# Patient Record
Sex: Female | Born: 1990 | Hispanic: Yes | Marital: Single | Smoking: Never smoker
Health system: Southern US, Community
[De-identification: ages and names within clinical notes are randomized; demographics above are authoritative.]

## PROBLEM LIST (undated history)

## (undated) DIAGNOSIS — G43909 Migraine, unspecified, not intractable, without status migrainosus: Secondary | ICD-10-CM

## (undated) DIAGNOSIS — C569 Malignant neoplasm of unspecified ovary: Secondary | ICD-10-CM

## (undated) DIAGNOSIS — D4959 Neoplasm of unspecified behavior of other genitourinary organ: Secondary | ICD-10-CM

## (undated) HISTORY — PX: OVARY SURGERY: SHX727

## (undated) HISTORY — DX: Migraine, unspecified, not intractable, without status migrainosus: G43.909

## (undated) HISTORY — DX: Neoplasm of unspecified behavior of other genitourinary organ: D49.59

## (undated) HISTORY — DX: Malignant neoplasm of unspecified ovary: C56.9

---
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
## 2019-09-24 ENCOUNTER — Encounter: Payer: Self-pay | Admitting: Obstetrics and Gynecology

## 2019-09-24 ENCOUNTER — Other Ambulatory Visit: Payer: Self-pay

## 2019-09-24 ENCOUNTER — Ambulatory Visit (INDEPENDENT_AMBULATORY_CARE_PROVIDER_SITE_OTHER): Payer: 59 | Admitting: Obstetrics and Gynecology

## 2019-09-24 VITALS — BP 115/79 | HR 86 | Ht 63.0 in | Wt 161.8 lb

## 2019-09-24 DIAGNOSIS — N912 Amenorrhea, unspecified: Secondary | ICD-10-CM

## 2019-09-24 LAB — POCT URINE PREGNANCY: Preg Test, Ur: POSITIVE — AB

## 2019-09-24 NOTE — Progress Notes (Signed)
HPI:      Ms. Whitney Day is a 29 y.o. No obstetric history on file. who LMP was No LMP recorded.  Subjective:   She presents today for pregnancy confirmation after missing her menstrual period. Patient believes she is approximately 9 weeks estimated gestational age based on last menstrual period.  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  She has been vaccinated.  She is experiencing daily nausea but not vomiting. She is taking prenatal vitamins.    Hx: The following portions of the patient's history were reviewed and updated as appropriate:             She  has a past medical history of Germ cell tumor of ovary. She does not have a problem list on file. She  has no past surgical history on file. Her family history includes Diabetes in her maternal grandfather and mother. She  reports that she has never smoked. She has never used smokeless tobacco. She reports previous alcohol use. She reports previous drug use. She currently has no medications in their medication list. She has No Known Allergies.       Review of Systems:  Review of Systems  Constitutional: Denied constitutional symptoms, night sweats, recent illness, fatigue, fever, insomnia and weight loss.  Eyes: Denied eye symptoms, eye pain, photophobia, vision change and visual disturbance.  Ears/Nose/Throat/Neck: Denied ear, nose, throat or neck symptoms, hearing loss, nasal discharge, sinus congestion and sore throat.  Cardiovascular: Denied cardiovascular symptoms, arrhythmia, chest pain/pressure, edema, exercise intolerance, orthopnea and palpitations.  Respiratory: Denied pulmonary symptoms, asthma, pleuritic pain, productive sputum, cough, dyspnea and wheezing.  Gastrointestinal: Denied, gastro-esophageal reflux, melena, nausea and vomiting.  Genitourinary: Denied genitourinary symptoms including symptomatic vaginal discharge, pelvic relaxation issues, and urinary complaints.  Musculoskeletal:  Denied musculoskeletal symptoms, stiffness, swelling, muscle weakness and myalgia.  Dermatologic: Denied dermatology symptoms, rash and scar.  Neurologic: Denied neurology symptoms, dizziness, headache, neck pain and syncope.  Psychiatric: Denied psychiatric symptoms, anxiety and depression.  Endocrine: Denied endocrine symptoms including hot flashes and night sweats.   Meds:   No current outpatient medications on file prior to visit.   No current facility-administered medications on file prior to visit.    Objective:     Vitals:   09/24/19 1109  BP: 115/79  Pulse: 86              Urinary beta-hCG positive  Assessment:    No obstetric history on file. There are no problems to display for this patient.    1. Amenorrhea     Approximately 9 weeks estimated gestational age   Plan:            Prenatal Plan 1.  The patient was given prenatal literature. 2.  She was continued on prenatal vitamins. 3.  A prenatal lab panel was ordered or drawn. 4.  An ultrasound was ordered to better determine an EDC. 5.  A nurse visit was scheduled. 6.  Genetic testing and testing for other inheritable conditions discussed in detail. She will decide in the future whether to have these labs performed. 7.  A general overview of pregnancy testing, visit schedule, ultrasound schedule, and prenatal care was discussed. 8.  COVID and its risks associated with pregnancy, prevention by limiting exposure and use of masks, as well as the risks and benefits of vaccination during pregnancy were discussed in detail.  [facility] policy regarding office and hospital visitation and testing was explained.  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  She is considering transfer to a different unit. 9.  Benefits of breast-feeding discussed in detail including both maternal and infant benefits. Ready Set Baby website discussed.    Orders Orders Placed This Encounter   Procedures  . POCT urine pregnancy    No orders of the defined types were placed in this encounter.     F/U  Return in about 4 weeks (around 10/22/2019).  Finis Bud, M.D. 09/24/2019 11:41 AM

## 2019-10-01 ENCOUNTER — Other Ambulatory Visit: Payer: Self-pay | Admitting: Obstetrics and Gynecology

## 2019-10-01 ENCOUNTER — Ambulatory Visit (INDEPENDENT_AMBULATORY_CARE_PROVIDER_SITE_OTHER): Payer: 59

## 2019-10-01 ENCOUNTER — Other Ambulatory Visit: Payer: Self-pay

## 2019-10-01 ENCOUNTER — Other Ambulatory Visit: Payer: 59

## 2019-10-01 DIAGNOSIS — Z3A10 10 weeks gestation of pregnancy: Secondary | ICD-10-CM | POA: Diagnosis not present

## 2019-10-01 DIAGNOSIS — Z789 Other specified health status: Secondary | ICD-10-CM

## 2019-10-18 ENCOUNTER — Other Ambulatory Visit: Payer: Self-pay | Admitting: Certified Nurse Midwife

## 2019-10-18 ENCOUNTER — Other Ambulatory Visit: Payer: Self-pay

## 2019-10-18 ENCOUNTER — Encounter: Payer: Self-pay | Admitting: Certified Nurse Midwife

## 2019-10-18 ENCOUNTER — Ambulatory Visit (INDEPENDENT_AMBULATORY_CARE_PROVIDER_SITE_OTHER): Payer: 59 | Admitting: Certified Nurse Midwife

## 2019-10-18 VITALS — BP 124/79 | HR 109 | Ht 64.0 in | Wt 166.1 lb

## 2019-10-18 DIAGNOSIS — Z3401 Encounter for supervision of normal first pregnancy, first trimester: Secondary | ICD-10-CM

## 2019-10-18 DIAGNOSIS — Z0283 Encounter for blood-alcohol and blood-drug test: Secondary | ICD-10-CM | POA: Diagnosis not present

## 2019-10-18 DIAGNOSIS — Z113 Encounter for screening for infections with a predominantly sexual mode of transmission: Secondary | ICD-10-CM

## 2019-10-18 NOTE — Progress Notes (Signed)
Whitney Day presents for NOB nurse intake visit. Pregnancy confirmation done at [facility], 09/24/2019, with Renee Harder.  G 1.  P0.  LMP 07/22/2019 EDD 04/27/2020.  Ga [redacted]w[redacted]d . Pregnancy education material explained and given.  0 cats in the home.  2 dogs in home. NOB labs ordered. BMI less than 30. TSH/HbgA1c not ordered. Sickle cell order due to race. HIV and drug screen explained and ordered. Genetic screening discussed. Genetic testing; Ordered. Pt to discuss genetic testing with provider. PNV encouraged. Pt to follow up with provider in 3 days for NOB physical. [place], FMLA forms and HIV/Drug Screening forms signed and reviewed with patient.    Pt requested to see a midwife.

## 2019-10-18 NOTE — Patient Instructions (Signed)
WHAT OB PATIENTS CAN EXPECT   Confirmation of pregnancy and ultrasound ordered if medically indicated-[redacted] weeks gestation  New OB (NOB) intake with nurse and New OB (NOB) labs- [redacted] weeks gestation  New OB (NOB) physical examination with provider- 11/[redacted] weeks gestation  Flu vaccine-[redacted] weeks gestation  Anatomy scan-[redacted] weeks gestation  Glucose tolerance test, blood work to test for anemia, T-dap vaccine-[redacted] weeks gestation  Vaginal swabs/cultures-STD/Group B strep-[redacted] weeks gestation  Appointments every 4 weeks until 28 weeks  Every 2 weeks from 28 weeks until 36 weeks  Weekly visits from 36 weeks until delivery  Morning Sickness  Morning sickness is when you feel sick to your stomach (nauseous) during pregnancy. You may feel sick to your stomach and throw up (vomit). You may feel sick in the morning, but you can feel this way at any time of day. Some women feel very sick to their stomach and cannot stop throwing up (hyperemesis gravidarum). Follow these instructions at home: Medicines  Take over-the-counter and prescription medicines only as told by your doctor. Do not take any medicines until you talk with your doctor about them first.  Taking multivitamins before getting pregnant can stop or lessen the harshness of morning sickness. Eating and drinking  Eat dry toast or crackers before getting out of bed.  Eat 5 or 6 small meals a day.  Eat dry and bland foods like rice and baked potatoes.  Do not eat greasy, fatty, or spicy foods.  Have someone cook for you if the smell of food causes you to feel sick or throw up.  If you feel sick to your stomach after taking prenatal vitamins, take them at night or with a snack.  Eat protein when you need a snack. Nuts, yogurt, and cheese are good choices.  Drink fluids throughout the day.  Try ginger ale made with real ginger, ginger tea made from fresh grated ginger, or ginger candies. General instructions  Do not use any products  that have nicotine or tobacco in them, such as cigarettes and e-cigarettes. If you need help quitting, ask your doctor.  Use an air purifier to keep the air in your house free of smells.  Get lots of fresh air.  Try to avoid smells that make you feel sick.  Try: ? Wearing a bracelet that is used for seasickness (acupressure wristband). ? Going to a doctor who puts thin needles into certain body points (acupuncture) to improve how you feel. Contact a doctor if:  You need medicine to feel better.  You feel dizzy or light-headed.  You are losing weight. Get help right away if:  You feel very sick to your stomach and cannot stop throwing up.  You pass out (faint).  You have very bad pain in your belly. Summary  Morning sickness is when you feel sick to your stomach (nauseous) during pregnancy.  You may feel sick in the morning, but you can feel this way at any time of day.  Making some changes to what you eat may help your symptoms go away. This information is not intended to replace advice given to you by your health care provider. Make sure you discuss any questions you have with your health care provider. Document Revised: 01/27/2017 Document Reviewed: 03/17/2016 Elsevier Patient Education  2020 [employer]. How a Baby Grows During Pregnancy  Pregnancy begins when a female's sperm enters a female's egg (fertilization). Fertilization usually happens in one of the tubes (fallopian tubes) that connect the ovaries to the  womb (uterus). The fertilized egg moves down the fallopian tube to the uterus. Once it reaches the uterus, it implants into the lining of the uterus and begins to grow. For the first 10 weeks, the fertilized egg is called an embryo. After 10 weeks, it is called a fetus. As the fetus continues to grow, it receives oxygen and nutrients through tissue (placenta) that grows to support the developing baby. The placenta is the life support system for the baby. It provides  oxygen and nutrition and removes waste. Learning as much as you can about your pregnancy and how your baby is developing can help you enjoy the experience. It can also make you aware of when there might be a problem and when to ask questions. How long does a typical pregnancy last? A pregnancy usually lasts 280 days, or about 40 weeks. Pregnancy is divided into three periods of growth, also called trimesters:  First trimester: 0-12 weeks.  Second trimester: 13-27 weeks.  Third trimester: 28-40 weeks. The day when your baby is ready to be born (full term) is your estimated date of delivery. How does my baby develop month by month? First month  The fertilized egg attaches to the inside of the uterus.  Some cells will form the placenta. Others will form the fetus.  The arms, legs, brain, spinal cord, lungs, and heart begin to develop.  At the end of the first month, the heart begins to beat. Second month  The bones, inner ear, eyelids, hands, and feet form.  The genitals develop.  By the end of 8 weeks, all major organs are developing. Third month  All of the internal organs are forming.  Teeth develop below the gums.  Bones and muscles begin to grow. The spine can flex.  The skin is transparent.  Fingernails and toenails begin to form.  Arms and legs continue to grow longer, and hands and feet develop.  The fetus is about 3 inches (7.6 cm) long. Fourth month  The placenta is completely formed.  The external sex organs, neck, outer ear, eyebrows, eyelids, and fingernails are formed.  The fetus can hear, swallow, and move its arms and legs.  The kidneys begin to produce urine.  The skin is covered with a white, waxy coating (vernix) and very fine hair (lanugo). Fifth month  The fetus moves around more and can be felt for the first time (quickening).  The fetus starts to sleep and wake up and may begin to suck its finger.  The nails grow to the end of the  fingers.  The organ in the digestive system that makes bile (gallbladder) functions and helps to digest nutrients.  If your baby is a girl, eggs are present in her ovaries. If your baby is a boy, testicles start to move down into his scrotum. Sixth month  The lungs are formed.  The eyes open. The brain continues to develop.  Your baby has fingerprints and toe prints. Your baby's hair grows thicker.  At the end of the second trimester, the fetus is about 9 inches (22.9 cm) long. Seventh month  The fetus kicks and stretches.  The eyes are developed enough to sense changes in light.  The hands can make a grasping motion.  The fetus responds to sound. Eighth month  All organs and body systems are fully developed and functioning.  Bones harden, and taste buds develop. The fetus may hiccup.  Certain areas of the brain are still developing. The skull remains soft.   Ninth month  The fetus gains about  lb (0.23 kg) each week.  The lungs are fully developed.  Patterns of sleep develop.  The fetus's head typically moves into a head-down position (vertex) in the uterus to prepare for birth.  The fetus weighs 6-9 lb (2.72-4.08 kg) and is 19-20 inches (48.26-50.8 cm) long. What can I do to have a healthy pregnancy and help my baby develop? General instructions  Take prenatal vitamins as directed by your health care provider. These include vitamins such as folic acid, iron, calcium, and vitamin D. They are important for healthy development.  Take medicines only as directed by your health care provider. Read labels and ask a pharmacist or your health care provider whether over-the-counter medicines, supplements, and prescription drugs are safe to take during pregnancy.  Keep all follow-up visits as directed by your health care provider. This is important. Follow-up visits include prenatal care and screening tests. How do I know if my baby is developing well? At each prenatal visit,  your health care provider will do several different tests to check on your health and keep track of your baby's development. These include:  Fundal height and position. ? Your health care provider will measure your growing belly from your pubic bone to the top of the uterus using a tape measure. ? Your health care provider will also feel your belly to determine your baby's position.  Heartbeat. ? An ultrasound in the first trimester can confirm pregnancy and show a heartbeat, depending on how far along you are. ? Your health care provider will check your baby's heart rate at every prenatal visit.  Second trimester ultrasound. ? This ultrasound checks your baby's development. It also may show your baby's gender. What should I do if I have concerns about my baby's development? Always talk with your health care provider about any concerns that you may have about your pregnancy and your baby. Summary  A pregnancy usually lasts 280 days, or about 40 weeks. Pregnancy is divided into three periods of growth, also called trimesters.  Your health care provider will monitor your baby's growth and development throughout your pregnancy.  Follow your health care provider's recommendations about taking prenatal vitamins and medicines during your pregnancy.  Talk with your health care provider if you have any concerns about your pregnancy or your developing baby. This information is not intended to replace advice given to you by your health care provider. Make sure you discuss any questions you have with your health care provider. Document Revised: 06/07/2018 Document Reviewed: 12/28/2016 Elsevier Patient Education  2020 [place] of Pregnancy  The first trimester of pregnancy is from week 1 until the end of week 13 (months 1 through 3). During this time, your baby will begin to develop inside you. At 6-8 weeks, the eyes and face are formed, and the heartbeat can be seen on  ultrasound. At the end of 12 weeks, all the baby's organs are formed. Prenatal care is all the medical care you receive before the birth of your baby. Make sure you get good prenatal care and follow all of your doctor's instructions. Follow these instructions at home: Medicines  Take over-the-counter and prescription medicines only as told by your doctor. Some medicines are safe and some medicines are not safe during pregnancy.  Take a prenatal vitamin that contains at least 600 micrograms (mcg) of folic acid.  If you have trouble pooping (constipation), take medicine that will make your stool soft (stool  softener) if your doctor approves. Eating and drinking   Eat regular, healthy meals.  Your doctor will tell you the amount of weight gain that is right for you.  Avoid raw meat and uncooked cheese.  If you feel sick to your stomach (nauseous) or throw up (vomit): ? Eat 4 or 5 small meals a day instead of 3 large meals. ? Try eating a few soda crackers. ? Drink liquids between meals instead of during meals.  To prevent constipation: ? Eat foods that are high in fiber, like fresh fruits and vegetables, whole grains, and beans. ? Drink enough fluids to keep your pee (urine) clear or pale yellow. Activity  Exercise only as told by your doctor. Stop exercising if you have cramps or pain in your lower belly (abdomen) or low back.  Do not exercise if it is too hot, too humid, or if you are in a place of great height (high altitude).  Try to avoid standing for long periods of time. Move your legs often if you must stand in one place for a long time.  Avoid heavy lifting.  Wear low-heeled shoes. Sit and stand up straight.  You can have sex unless your doctor tells you not to. Relieving pain and discomfort  Wear a good support bra if your breasts are sore.  Take warm water baths (sitz baths) to soothe pain or discomfort caused by hemorrhoids. Use hemorrhoid cream if your doctor says  it is okay.  Rest with your legs raised if you have leg cramps or low back pain.  If you have puffy, bulging veins (varicose veins) in your legs: ? Wear support hose or compression stockings as told by your doctor. ? Raise (elevate) your feet for 15 minutes, 3-4 times a day. ? Limit salt in your food. Prenatal care  Schedule your prenatal visits by the twelfth week of pregnancy.  Write down your questions. Take them to your prenatal visits.  Keep all your prenatal visits as told by your doctor. This is important. Safety  Wear your seat belt at all times when driving.  Make a list of emergency phone numbers. The list should include numbers for family, friends, the hospital, and police and fire departments. General instructions  Ask your doctor for a referral to a local prenatal class. Begin classes no later than at the start of month 6 of your pregnancy.  Ask for help if you need counseling or if you need help with nutrition. Your doctor can give you advice or tell you where to go for help.  Do not use hot tubs, steam rooms, or saunas.  Do not douche or use tampons or scented sanitary pads.  Do not cross your legs for long periods of time.  Avoid all herbs and alcohol. Avoid drugs that are not approved by your doctor.  Do not use any tobacco products, including cigarettes, chewing tobacco, and electronic cigarettes. If you need help quitting, ask your doctor. You may get counseling or other support to help you quit.  Avoid cat litter boxes and soil used by cats. These carry germs that can cause birth defects in the baby and can cause a loss of your baby (miscarriage) or stillbirth.  Visit your dentist. At home, brush your teeth with a soft toothbrush. Be gentle when you floss. Contact a doctor if:  You are dizzy.  You have mild cramps or pressure in your lower belly.  You have a nagging pain in your belly area.  You  continue to feel sick to your stomach, you throw up, or  you have watery poop (diarrhea).  You have a bad smelling fluid coming from your vagina.  You have pain when you pee (urinate).  You have increased puffiness (swelling) in your face, hands, legs, or ankles. Get help right away if:  You have a fever.  You are leaking fluid from your vagina.  You have spotting or bleeding from your vagina.  You have very bad belly cramping or pain.  You gain or lose weight rapidly.  You throw up blood. It may look like coffee grounds.  You are around people who have [place] measles, fifth disease, or chickenpox.  You have a very bad headache.  You have shortness of breath.  You have any kind of trauma, such as from a fall or a car accident. Summary  The first trimester of pregnancy is from week 1 until the end of week 13 (months 1 through 3).  To take care of yourself and your unborn baby, you will need to eat healthy meals, take medicines only if your doctor tells you to do so, and do activities that are safe for you and your baby.  Keep all follow-up visits as told by your doctor. This is important as your doctor will have to ensure that your baby is healthy and growing well. This information is not intended to replace advice given to you by your health care provider. Make sure you discuss any questions you have with your health care provider. Document Revised: 06/07/2018 Document Reviewed: 02/23/2016 Elsevier Patient Education  2020 [employer]. Commonly Asked Questions During Pregnancy  Cats: A parasite can be excreted in cat feces.  To avoid exposure you need to have another person empty the little box.  If you must empty the litter box you will need to wear gloves.  Wash your hands after handling your cat.  This parasite can also be found in raw or undercooked meat so this should also be avoided.  Colds, Sore Throats, Flu: Please check your medication sheet to see what you can take for symptoms.  If your symptoms are unrelieved by these  medications please call the office.  Dental Work: Most any dental work [employer] banker[employer] corporate recommends is permitted.  X-rays should only be taken during the first trimester if absolutely necessary.  Your abdomen should be shielded with a lead apron during all x-rays.  Please notify your provider prior to receiving any x-rays.  Novocaine is fine; gas is not recommended.  If your dentist requires a note from [place] prior to dental work please call the office and we will provide one for you.  Exercise: Exercise is an important part of staying healthy during your pregnancy.  You may continue most exercises you were accustomed to prior to pregnancy.  Later in your pregnancy you will most likely notice you have difficulty with activities requiring balance like riding a bicycle.  It is important that you listen to your body and avoid activities that put you at a higher risk of falling.  Adequate rest and staying well hydrated are a must!  If you have questions about the safety of specific activities ask your provider.    Exposure to Children with illness: Try to avoid obvious exposure; report any symptoms to [place] when noted,  If you have chicken pos, red measles or mumps, you should be immune to these diseases.   Please do not take any vaccines while pregnant unless you have checked  with your OB provider.  Fetal Movement: After 28 weeks we recommend you do "kick counts" twice daily.  Lie or sit down in a calm quiet environment and count your baby movements "kicks".  You should feel your baby at least 10 times per hour.  If you have not felt 10 kicks within the first hour get up, walk around and have something sweet to eat or drink then repeat for an additional hour.  If count remains less than 10 per hour notify your provider.  Fumigating: Follow your pest control agent's advice as to how long to stay out of your home.  Ventilate the area well before re-entering.  Hemorrhoids:   Most over-the-counter preparations can be used  during pregnancy.  Check your medication to see what is safe to use.  It is important to use a stool softener or fiber in your diet and to drink lots of liquids.  If hemorrhoids seem to be getting worse please call the office.   Hot Tubs:  Hot tubs Jacuzzis and saunas are not recommended while pregnant.  These increase your internal body temperature and should be avoided.  Intercourse:  Sexual intercourse is safe during pregnancy as long as you are comfortable, unless otherwise advised by your provider.  Spotting may occur after intercourse; report any bright red bleeding that is heavier than spotting.  Labor:  If you know that you are in labor, please go to the hospital.  If you are unsure, please call the office and let us help you decide what to do.  Lifting, straining, etc:  If your job requires heavy lifting or straining please check with your provider for any limitations.  Generally, you should not lift items heavier than that you can lift simply with your hands and arms (no back muscles)  Painting:  Paint fumes do not harm your pregnancy, but may make you ill and should be avoided if possible.  Latex or water based paints have less odor than oils.  Use adequate ventilation while painting.  Permanents & Hair Color:  Chemicals in hair dyes are not recommended as they cause increase hair dryness which can increase hair loss during pregnancy.  " Highlighting" and permanents are allowed.  Dye may be absorbed differently and permanents may not hold as well during pregnancy.  Sunbathing:  Use a sunscreen, as skin burns easily during pregnancy.  Drink plenty of fluids; avoid over heating.  Tanning Beds:  Because their possible side effects are still unknown, tanning beds are not recommended.  Ultrasound Scans:  Routine ultrasounds are performed at approximately 20 weeks.  You will be able to see your baby's general anatomy an if you would like to know the gender this can usually be determined as  well.  If it is questionable when you conceived you may also receive an ultrasound early in your pregnancy for dating purposes.  Otherwise ultrasound exams are not routinely performed unless there is a medical necessity.  Although you can request a scan we ask that you pay for it when conducted because insurance does not cover " patient request" scans.  Work: If your pregnancy proceeds without complications you may work until your due date, unless your physician or employer advises otherwise.  Round Ligament Pain/Pelvic Discomfort:  Sharp, shooting pains not associated with bleeding are fairly common, usually occurring in the second trimester of pregnancy.  They tend to be worse when standing up or when you remain standing for long periods of time.  These are  the result of pressure of certain pelvic ligaments called "round ligaments".  Rest, Tylenol and heat seem to be the most effective relief.  As the womb and fetus grow, they rise out of the pelvis and the discomfort improves.  Please notify the office if your pain seems different than that described.  It may represent a more serious condition.  Common Medications Safe in Pregnancy  Acne:      Constipation:  Benzoyl Peroxide     Colace  Clindamycin      Dulcolax Suppository  Topica Erythromycin     Fibercon  Salicylic Acid      Metamucil         Miralax AVOID:        Senakot   Accutane    Cough:  Retin-A       Cough Drops  Tetracycline      Phenergan w/ Codeine if Rx  Minocycline      Robitussin (Plain & DM)  Antibiotics:     Crabs/Lice:  Ceclor       RID  Cephalosporins    AVOID:  E-Mycins      Kwell  Keflex  Macrobid/Macrodantin   Diarrhea:  Penicillin      Kao-Pectate  Zithromax      Imodium AD         PUSH FLUIDS AVOID:       Cipro     Fever:  Tetracycline      Tylenol (Regular or Extra  Minocycline       Strength)  Levaquin      Extra Strength-Do not          Exceed 8 tabs/24 hrs Caffeine:        <200mg/day (equiv. To 1 cup  of coffee or  approx. 3 12 oz sodas)         Gas: Cold/Hayfever:       Gas-X  Benadryl      Mylicon  Claritin       Phazyme  **Claritin-D        Chlor-Trimeton    Headaches:  Dimetapp      ASA-Free Excedrin  Drixoral-Non-Drowsy     Cold Compress  Mucinex (Guaifenasin)     Tylenol (Regular or Extra  Sudafed/Sudafed-12 Hour     Strength)  **Sudafed PE Pseudoephedrine   Tylenol Cold & Sinus     Vicks Vapor Rub  Zyrtec  **AVOID if Problems With Blood Pressure         Heartburn: Avoid lying down for at least 1 hour after meals  Aciphex      Maalox     Rash:  Milk of Magnesia     Benadryl    Mylanta       1% Hydrocortisone Cream  Pepcid  Pepcid Complete   Sleep Aids:  Prevacid      Ambien   Prilosec       Benadryl  Rolaids       Chamomile Tea  Tums (Limit 4/day)     Unisom         Tylenol PM         Warm milk-add vanilla or  Hemorrhoids:       Sugar for taste  Anusol/Anusol H.C.  (RX: Analapram 2.5%)  Sugar Substitutes:  Hydrocortisone OTC     Ok in moderation  Preparation H      Tucks        Vaseline lotion applied to tissue with wiping    Herpes:       Throat:  Acyclovir      Oragel  Famvir  Valtrex     Vaccines:         Flu Shot Leg Cramps:       *Gardasil  Benadryl      Hepatitis A         Hepatitis B Nasal Spray:       Pneumovax  Saline Nasal Spray     Polio Booster         Tetanus Nausea:       Tuberculosis test or PPD  Vitamin B6 25 mg TID   AVOID:    Dramamine      *Gardasil  Emetrol       Live Poliovirus  Ginger Root 250 mg QID    MMR (measles, mumps &  High Complex Carbs @ Bedtime    rebella)  Sea Bands-Accupressure    Varicella (Chickenpox)  Unisom 1/2 tab TID     *No known complications           If received before Pain:         Known pregnancy;   Darvocet       Resume series after  Lortab        Delivery  Percocet    Yeast:   Tramadol      Femstat  Tylenol 3      Gyne-lotrimin  Ultram       Monistat  Vicodin           MISC:         All  Sunscreens           Hair Coloring/highlights          Insect Repellant's          (Including DEET)         Mystic Tans

## 2019-10-21 ENCOUNTER — Ambulatory Visit (INDEPENDENT_AMBULATORY_CARE_PROVIDER_SITE_OTHER): Payer: 59 | Admitting: Certified Nurse Midwife

## 2019-10-21 ENCOUNTER — Other Ambulatory Visit: Payer: Self-pay

## 2019-10-21 ENCOUNTER — Encounter: Payer: Self-pay | Admitting: Certified Nurse Midwife

## 2019-10-21 ENCOUNTER — Other Ambulatory Visit (HOSPITAL_COMMUNITY)
Admission: RE | Admit: 2019-10-21 | Discharge: 2019-10-21 | Disposition: A | Discharge status: Home / Self Care | Payer: 59 | Source: Ambulatory Visit | Attending: Obstetrics and Gynecology | Admitting: Obstetrics and Gynecology

## 2019-10-21 VITALS — BP 114/62 | HR 94 | Wt 165.3 lb

## 2019-10-21 DIAGNOSIS — Z87898 Personal history of other specified conditions: Secondary | ICD-10-CM | POA: Insufficient documentation

## 2019-10-21 DIAGNOSIS — Z3402 Encounter for supervision of normal first pregnancy, second trimester: Secondary | ICD-10-CM | POA: Diagnosis present

## 2019-10-21 DIAGNOSIS — Z3A13 13 weeks gestation of pregnancy: Secondary | ICD-10-CM | POA: Diagnosis present

## 2019-10-21 DIAGNOSIS — Z131 Encounter for screening for diabetes mellitus: Secondary | ICD-10-CM

## 2019-10-21 DIAGNOSIS — Z8349 Family history of other endocrine, nutritional and metabolic diseases: Secondary | ICD-10-CM

## 2019-10-21 DIAGNOSIS — Z833 Family history of diabetes mellitus: Secondary | ICD-10-CM

## 2019-10-21 DIAGNOSIS — Z8742 Personal history of other diseases of the female genital tract: Secondary | ICD-10-CM | POA: Insufficient documentation

## 2019-10-21 DIAGNOSIS — Z124 Encounter for screening for malignant neoplasm of cervix: Secondary | ICD-10-CM

## 2019-10-21 DIAGNOSIS — O210 Mild hyperemesis gravidarum: Secondary | ICD-10-CM

## 2019-10-21 DIAGNOSIS — Z1379 Encounter for other screening for genetic and chromosomal anomalies: Secondary | ICD-10-CM

## 2019-10-21 DIAGNOSIS — R399 Unspecified symptoms and signs involving the genitourinary system: Secondary | ICD-10-CM

## 2019-10-21 LAB — POCT URINALYSIS DIPSTICK OB
Bilirubin, UA: NEGATIVE
Blood, UA: NEGATIVE
Glucose, UA: NEGATIVE
Ketones, UA: NEGATIVE
Leukocytes, UA: NEGATIVE
Nitrite, UA: NEGATIVE
POC,PROTEIN,UA: NEGATIVE
Spec Grav, UA: 1.015 (ref 1.010–1.025)
Urobilinogen, UA: 0.2 E.U./dL
pH, UA: 5 (ref 5.0–8.0)

## 2019-10-21 LAB — HGB SOLU + RFLX FRAC: Sickle Solubility Test - HGBRFX: NEGATIVE

## 2019-10-21 LAB — TOXOPLASMA ANTIBODIES- IGG AND  IGM
Toxoplasma Antibody- IgM: <3 AU/mL (ref 0.0–7.9)
Toxoplasma IgG Ratio: <3 IU/mL (ref 0.0–7.1)

## 2019-10-21 LAB — HEPATITIS B SURFACE ANTIGEN: Hepatitis B Surface Ag: NEGATIVE

## 2019-10-21 LAB — ABO AND RH: Rh Factor: POSITIVE

## 2019-10-21 LAB — RUBELLA SCREEN: Rubella Antibodies, IGG: <0.9 index — ABNORMAL LOW (ref >0.99)

## 2019-10-21 LAB — VARICELLA ZOSTER ANTIBODY, IGG: Varicella zoster IgG: 215 index (ref >165)

## 2019-10-21 LAB — RPR: RPR Ser Ql: NONREACTIVE

## 2019-10-21 LAB — HIV ANTIBODY (ROUTINE TESTING W REFLEX): HIV Screen 4th Generation wRfx: NONREACTIVE

## 2019-10-21 LAB — ANTIBODY SCREEN: Antibody Screen: NEGATIVE

## 2019-10-21 MED ORDER — NITROFURANTOIN MONOHYD MACRO 100 MG PO CAPS: 100.0000 mg | ORAL_CAPSULE | Freq: Two times a day (BID) | ORAL | 0 refills | Status: DC

## 2019-10-21 NOTE — Patient Instructions (Addendum)
Healthy Weight Gain During Pregnancy, Adult A certain amount of weight gain during pregnancy is normal and healthy. How much weight you should gain depends on your overall health and a measurement called BMI (body mass index). BMI is an estimate of your body fat based on your height and weight. You can use an online calculator to figure out your BMI, or you can ask your health care provider to calculate it for you at your next visit. Your recommended pregnancy weight gain is based on your pre-pregnancy BMI. General guidelines for a healthy total weight gain during pregnancy are listed below. If your BMI at or before the start of your pregnancy is:  Less than 18.5 (underweight), you should gain 28-40 lb (13-18 kg).  18.5-24.9 (normal weight), you should gain 25-35 lb (11-16 kg).  25-29.9 (overweight), you should gain 15-25 lb (7-11 kg).  30 or higher (obese), you should gain 11-20 lb (5-9 kg). These ranges vary depending on your individual health. If you are carrying more than one baby (multiples), it may be safe to gain more weight than these recommendations. If you gain less weight than recommended, that may be safe as long as your baby is growing and developing normally. How can unhealthy weight gain affect me and my baby? Gaining too much weight during pregnancy can lead to pregnancy complications, such as:  A temporary form of diabetes that develops during pregnancy (gestational diabetes).  High blood pressure during pregnancy and protein in your urine (preeclampsia).  High blood pressure during pregnancy without protein in your urine (gestational hypertension).  Your baby having a high weight at birth, which may: ? Raise your risk of having a more difficult delivery or a surgical delivery (cesarean delivery, or C-section). ? Raise your child's risk of developing obesity during childhood. Not gaining enough weight can be life-threatening for your baby, and it may raise your baby's chances  of:  Being born early (preterm).  Growing more slowly than normal during pregnancy (growth restriction).  Having a low weight at birth. What actions can I take to gain a healthy amount of weight during pregnancy? General instructions  Keep track of your weight gain during pregnancy.  Take over-the-counter and prescription medicines only as told by your health care provider. Take all prenatal supplements as directed.  Keep all health care visits during pregnancy (prenatal visits). These visits are a good time to discuss your weight gain. Your health care provider will weigh you at each visit to make sure you are gaining a healthy amount of weight. Nutrition   Eat a balanced, nutrient-rich diet. Eat plenty of: ? Fruits and vegetables, such as berries and broccoli. ? Whole grains, such as millet, barley, whole-wheat breads and cereals, and oatmeal. ? Low-fat dairy products or non-dairy products such as almond milk or rice milk. ? Protein foods, such as lean meat, chicken, eggs, and legumes (such as peas, beans, soybeans, and lentils).  Avoid foods that are fried or have a lot of fat, salt (sodium), or sugar.  Drink enough fluid to keep your urine pale yellow.  Choose healthy snack and drink options when you are at work or on the go: ? Drink water. Avoid soda, sports drinks, and juices that have added sugar. ? Avoid drinks with caffeine, such as coffee and energy drinks. ? Eat snacks that are high in protein, such as nuts, protein bars, and low-fat yogurt. ? Carry convenient snacks in your purse that do not need refrigeration, such as a pack of   trail mix, an apple, or a granola bar.  If you need help improving your diet, work with a health care provider or a diet and nutrition specialist (dietitian). Activity   Exercise regularly, as told by your health care provider. ? If you were active before becoming pregnant, you may be able to continue your regular fitness activities. ? If  you were not active before pregnancy, you may gradually build up to exercising for 30 or more minutes on most days of the week. This may include walking, swimming, or yoga.  Ask your health care provider what activities are safe for you. Talk with your health care provider about whether you may need to be excused from certain school or work activities. Where to find more information Learn more about managing your weight gain during pregnancy from:  American Pregnancy Association: www.americanpregnancy.org  [place] Department of Agriculture pregnancy weight gain calculator: [employer] Summary  Too much weight gain during pregnancy can lead to complications for you and your baby.  Find out your pre-pregnancy BMI to determine how much weight gain is healthy for you.  Eat nutritious foods and stay active.  Keep all of your prenatal visits as told by your health care provider. This information is not intended to replace advice given to you by your health care provider. Make sure you discuss any questions you have with your health care provider. Document Revised: 11/07/2018 Document Reviewed: 11/04/2016 Elsevier Patient Education  [place].  Exercise During Pregnancy Exercise is an important part of being healthy for people of all ages. Exercise improves the function of your heart and lungs and helps you maintain strength, flexibility, and a healthy body weight. Exercise also boosts energy levels and elevates mood. Most women should exercise regularly during pregnancy. In rare cases, women with certain medical conditions or complications may be asked to limit or avoid exercise during pregnancy. How does this affect me? Along with maintaining general strength and flexibility, exercising during pregnancy can help:  Keep strength in muscles that are used during labor and childbirth.  Decrease low back pain.  Reduce symptoms of depression.  Control weight gain during  pregnancy.  Reduce the risk of needing insulin if you develop diabetes during pregnancy.  Decrease the risk of cesarean delivery.  Speed up your recovery after giving birth. How does this affect my baby? Exercise can help you have a healthy pregnancy. Exercise does not cause premature birth. It will not cause your baby to weigh less at birth. What exercises can I do? Many exercises are safe for you to do during pregnancy. Do a variety of exercises that safely increase your heart and breathing rates and help you build and maintain muscle strength. Do exercises exactly as told by your health care provider. You may do these exercises:  Walking or hiking.  Swimming.  Water aerobics.  Riding a stationary bike.  Strength training.  Modified yoga or Pilates. Tell your instructor that you are pregnant. Avoid overstretching, and avoid lying on your back for long periods of time.  Running or jogging. Only choose this type of exercise if you: ? Ran or jogged regularly before your pregnancy. ? Can run or jog and still talk in complete sentences. What exercises should I avoid? Depending on your level of fitness and whether you exercised regularly before your pregnancy, you may be told to limit high-intensity exercise. You can tell that you are exercising at a high intensity if you are breathing much harder and faster and cannot  hold a conversation while exercising. You must avoid:  Contact sports.  Activities that put you at risk for falling on or being hit in the belly, such as downhill skiing, water skiing, surfing, rock climbing, cycling, gymnastics, and horseback riding.  Scuba diving.  Skydiving.  Yoga or Pilates in a room that is heated to high temperatures.  Jogging or running, unless you ran or jogged regularly before your pregnancy. While jogging or running, you should always be able to talk in full sentences. Do not run or jog so fast that you are unable to have a  conversation.  Do not exercise at more than 6,000 feet above sea level (high elevation) if you are not used to exercising at high elevation. How do I exercise in a safe way?   Avoid overheating. Do not exercise in very high temperatures.  Wear loose-fitting, breathable clothes.  Avoid dehydration. Drink enough water before, during, and after exercise to keep your urine pale yellow.  Avoid overstretching. Because of hormone changes during pregnancy, it is easy to overstretch muscles, tendons, and ligaments during pregnancy.  Start slowly and ask your health care provider to recommend the types of exercise that are safe for you.  Do not exercise to lose weight. Follow these instructions at home:  Exercise on most days or all days of the week. Try to exercise for 30 minutes a day, 5 days a week, unless your health care provider tells you not to.  If you actively exercised before your pregnancy and you are healthy, your health care provider may tell you to continue to do moderate to high-intensity exercise.  If you are just starting to exercise or did not exercise much before your pregnancy, your health care provider may tell you to do low to moderate-intensity exercise. Questions to ask your health care provider  Is exercise safe for me?  What are signs that I should stop exercising?  Does my health condition mean that I should not exercise during pregnancy?  When should I avoid exercising during pregnancy? Stop exercising and contact a health care provider if: You have any unusual symptoms, such as:  Mild contractions of the uterus or cramps in the abdomen.  Dizziness that does not go away when you rest. Stop exercising and get help right away if: You have any unusual symptoms, such as:  Sudden, severe pain in your low back or your belly.  Mild contractions of the uterus or cramps in the abdomen that do not improve with rest and drinking fluids.  Chest pain.  Bleeding or  fluid leaking from your vagina.  Shortness of breath. These symptoms may represent a serious problem that is an emergency. Do not wait to see if the symptoms will go away. Get medical help right away. Call your local emergency services (911 in the [place] Do not drive yourself to the hospital. Summary  Most women should exercise regularly throughout pregnancy. In rare cases, women with certain medical conditions or complications may be asked to limit or avoid exercise during pregnancy.  Do not exercise to lose weight during pregnancy.  Your health care provider will tell you what level of physical activity is right for you.  Stop exercising and contact a health care provider if you have mild contractions of the uterus or cramps in the abdomen. Get help right away if these contractions or cramps do not improve with rest and drinking fluids.  Stop exercising and get help right away if you have sudden, severe pain  in your low back or belly, chest pain, shortness of breath, or bleeding or leaking of fluid from your vagina. This information is not intended to replace advice given to you by your health care provider. Make sure you discuss any questions you have with your health care provider. Document Revised: 06/07/2018 Document Reviewed: 03/21/2018 Elsevier Patient Education  [place].   Common Medications Safe in Pregnancy  Acne:      Constipation:  Benzoyl Peroxide     Colace  Clindamycin      Dulcolax Suppository  Topica Erythromycin     Fibercon  Salicylic Acid      Metamucil         Miralax AVOID:        Senakot   Accutane    Cough:  Retin-A       Cough Drops  Tetracycline      Phenergan w/ Codeine if Rx  Minocycline      Robitussin (Plain & DM)  Antibiotics:     Crabs/Lice:  Ceclor       RID  Cephalosporins    AVOID:  E-Mycins      Kwell  Keflex  Macrobid/Macrodantin   Diarrhea:  Penicillin      Kao-Pectate  Zithromax      Imodium AD         PUSH  FLUIDS AVOID:       Cipro     Fever:  Tetracycline      Tylenol (Regular or Extra  Minocycline       Strength)  Levaquin      Extra Strength-Do not          Exceed 8 tabs/24 hrs Caffeine:        <284m/day (equiv. To 1 cup of coffee or  approx. 3 12 oz sodas)         Gas: Cold/Hayfever:       Gas-X  Benadryl      Mylicon  Claritin       Phazyme  **Claritin-D        Chlor-Trimeton    Headaches:  Dimetapp      ASA-Free Excedrin  Drixoral-Non-Drowsy     Cold Compress  Mucinex (Guaifenasin)     Tylenol (Regular or Extra  Sudafed/Sudafed-12 Hour     Strength)  **Sudafed PE Pseudoephedrine   Tylenol Cold & Sinus     Vicks Vapor Rub  Zyrtec  **AVOID if Problems With Blood Pressure         Heartburn: Avoid lying down for at least 1 hour after meals  Aciphex      Maalox     Rash:  Milk of Magnesia     Benadryl    Mylanta       1% Hydrocortisone Cream  Pepcid  Pepcid Complete   Sleep Aids:  Prevacid      Ambien   Prilosec       Benadryl  Rolaids       Chamomile Tea  Tums (Limit 4/day)     Unisom         Tylenol PM         Warm milk-add vanilla or  Hemorrhoids:       Sugar for taste  Anusol/Anusol H.C.  (RX: Analapram 2.5%)  Sugar Substitutes:  Hydrocortisone OTC     Ok in moderation  Preparation H      Tucks        Vaseline lotion applied to tissue with wiping  Herpes:     Throat:  Acyclovir      Oragel  Famvir  Valtrex     Vaccines:         Flu Shot Leg Cramps:       *Gardasil  Benadryl      Hepatitis A         Hepatitis B Nasal Spray:       Pneumovax  Saline Nasal Spray     Polio Booster         Tetanus Nausea:       Tuberculosis test or PPD  Vitamin B6 25 mg TID   AVOID:    Dramamine      *Gardasil  Emetrol       Live Poliovirus  Ginger Root 250 mg QID    MMR (measles, mumps &  High Complex Carbs @ Bedtime    rebella)  Sea Bands-Accupressure    Varicella (Chickenpox)  Unisom 1/2 tab TID     *No known complications           If received  before Pain:         Known pregnancy;   Darvocet       Resume series after  Lortab        Delivery  Percocet    Yeast:   Tramadol      Femstat  Tylenol 3      Gyne-lotrimin  Ultram       Monistat  Vicodin           MISC:         All Sunscreens           Hair Coloring/highlights          Insect Repellant's          (Including DEET)         Mystic Tans    Second Trimester of Pregnancy  The second trimester is from week 14 through week 27 (month 4 through 6). This is often the time in pregnancy that you feel your best. Often times, morning sickness has lessened or quit. You may have more energy, and you may get hungry more often. Your unborn baby is growing rapidly. At the end of the sixth month, he or she is about 9 inches long and weighs about 1 pounds. You will likely feel the baby move between 18 and 20 weeks of pregnancy. Follow these instructions at home: Medicines  Take over-the-counter and prescription medicines only as told by your doctor. Some medicines are safe and some medicines are not safe during pregnancy.  Take a prenatal vitamin that contains at least 600 micrograms (mcg) of folic acid.  If you have trouble pooping (constipation), take medicine that will make your stool soft (stool softener) if your doctor approves. Eating and drinking   Eat regular, healthy meals.  Avoid raw meat and uncooked cheese.  If you get low calcium from the food you eat, talk to your doctor about taking a daily calcium supplement.  Avoid foods that are high in fat and sugars, such as fried and sweet foods.  If you feel sick to your stomach (nauseous) or throw up (vomit): ? Eat 4 or 5 small meals a day instead of 3 large meals. ? Try eating a few soda crackers. ? Drink liquids between meals instead of during meals.  To prevent constipation: ? Eat foods that are high in fiber, like fresh fruits and vegetables, whole grains, and beans. ? Drink enough fluids  to keep your pee (urine)  clear or pale yellow. Activity  Exercise only as told by your doctor. Stop exercising if you start to have cramps.  Do not exercise if it is too hot, too humid, or if you are in a place of great height (high altitude).  Avoid heavy lifting.  Wear low-heeled shoes. Sit and stand up straight.  You can continue to have sex unless your doctor tells you not to. Relieving pain and discomfort  Wear a good support bra if your breasts are tender.  Take warm water baths (sitz baths) to soothe pain or discomfort caused by hemorrhoids. Use hemorrhoid cream if your doctor approves.  Rest with your legs raised if you have leg cramps or low back pain.  If you develop puffy, bulging veins (varicose veins) in your legs: ? Wear support hose or compression stockings as told by your doctor. ? Raise (elevate) your feet for 15 minutes, 3-4 times a day. ? Limit salt in your food. Prenatal care  Write down your questions. Take them to your prenatal visits.  Keep all your prenatal visits as told by your doctor. This is important. Safety  Wear your seat belt when driving.  Make a list of emergency phone numbers, including numbers for family, friends, the hospital, and police and fire departments. General instructions  Ask your doctor about the right foods to eat or for help finding a counselor, if you need these services.  Ask your doctor about local prenatal classes. Begin classes before month 6 of your pregnancy.  Do not use hot tubs, steam rooms, or saunas.  Do not douche or use tampons or scented sanitary pads.  Do not cross your legs for long periods of time.  Visit your dentist if you have not done so. Use a soft toothbrush to brush your teeth. Floss gently.  Avoid all smoking, herbs, and alcohol. Avoid drugs that are not approved by your doctor.  Do not use any products that contain nicotine or tobacco, such as cigarettes and e-cigarettes. If you need help quitting, ask your  doctor.  Avoid cat litter boxes and soil used by cats. These carry germs that can cause birth defects in the baby and can cause a loss of your baby (miscarriage) or stillbirth. Contact a doctor if:  You have mild cramps or pressure in your lower belly.  You have pain when you pee (urinate).  You have bad smelling fluid coming from your vagina.  You continue to feel sick to your stomach (nauseous), throw up (vomit), or have watery poop (diarrhea).  You have a nagging pain in your belly area.  You feel dizzy. Get help right away if:  You have a fever.  You are leaking fluid from your vagina.  You have spotting or bleeding from your vagina.  You have severe belly cramping or pain.  You lose or gain weight rapidly.  You have trouble catching your breath and have chest pain.  You notice sudden or extreme puffiness (swelling) of your face, hands, ankles, feet, or legs.  You have not felt the baby move in over an hour.  You have severe headaches that do not go away when you take medicine.  You have trouble seeing. Summary  The second trimester is from week 14 through week 27 (months 4 through 6). This is often the time in pregnancy that you feel your best.  To take care of yourself and your unborn baby, you will need to eat healthy meals,  take medicines only if your doctor tells you to do so, and do activities that are safe for you and your baby.  Call your doctor if you get sick or if you notice anything unusual about your pregnancy. Also, call your doctor if you need help with the right food to eat, or if you want to know what activities are safe for you. This information is not intended to replace advice given to you by your health care provider. Make sure you discuss any questions you have with your health care provider. Document Revised: 06/08/2018 Document Reviewed: 03/22/2016 Elsevier Patient Education  [place].  Morning Sickness  Morning sickness is when  you feel sick to your stomach (nauseous) during pregnancy. You may feel sick to your stomach and throw up (vomit). You may feel sick in the morning, but you can feel this way at any time of day. Some women feel very sick to their stomach and cannot stop throwing up (hyperemesis gravidarum). Follow these instructions at home: Medicines  Take over-the-counter and prescription medicines only as told by your doctor. Do not take any medicines until you talk with your doctor about them first.  Taking multivitamins before getting pregnant can stop or lessen the harshness of morning sickness. Eating and drinking  Eat dry toast or crackers before getting out of bed.  Eat 5 or 6 small meals a day.  Eat dry and bland foods like rice and baked potatoes.  Do not eat greasy, fatty, or spicy foods.  Have someone cook for you if the smell of food causes you to feel sick or throw up.  If you feel sick to your stomach after taking prenatal vitamins, take them at night or with a snack.  Eat protein when you need a snack. Nuts, yogurt, and cheese are good choices.  Drink fluids throughout the day.  Try ginger ale made with real ginger, ginger tea made from fresh grated ginger, or ginger candies. General instructions  Do not use any products that have nicotine or tobacco in them, such as cigarettes and e-cigarettes. If you need help quitting, ask your doctor.  Use an air purifier to keep the air in your house free of smells.  Get lots of fresh air.  Try to avoid smells that make you feel sick.  Try: ? Wearing a bracelet that is used for seasickness (acupressure wristband). ? Going to a doctor who puts thin needles into certain body points (acupuncture) to improve how you feel. Contact a doctor if:  You need medicine to feel better.  You feel dizzy or light-headed.  You are losing weight. Get help right away if:  You feel very sick to your stomach and cannot stop throwing up.  You pass  out (faint).  You have very bad pain in your belly. Summary  Morning sickness is when you feel sick to your stomach (nauseous) during pregnancy.  You may feel sick in the morning, but you can feel this way at any time of day.  Making some changes to what you eat may help your symptoms go away. This information is not intended to replace advice given to you by your health care provider. Make sure you discuss any questions you have with your health care provider. Document Revised: 01/27/2017 Document Reviewed: 03/17/2016 Elsevier Patient Education  2020 [employer].

## 2019-10-21 NOTE — Progress Notes (Signed)
NEW OB HISTORY AND PHYSICAL  SUBJECTIVE:       Whitney Day is a 29 y.o. G1P0 female, Patient's last menstrual period was 07/22/2019., Estimated Date of Delivery: 04/27/20, [redacted]w[redacted]d, presents today for establishment of Prenatal Care.  Reports dysuria, intermittent cramping and back pain.   Endorses nausea without vomiting, heartburn and breast tenderness.   Denies difficulty breathing or respiratory distress, chest pain, vaginal bleeding, and leg pain or swelling.   Desires genetic screening (collected Friday). Prefers midwifery care.   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   History significant for germ cell tumor approximately eight (8) years ago.    Gynecologic History  Patient's last menstrual period was 07/22/2019.   Contraception: none   Last Pap: 2019. Results were: abnormal, LSIL with negative Colposcopy  Obstetric History  OB History  Gravida Para Term Preterm AB Living  2       1    SAB TAB Ectopic Multiple Live Births  0 1          # Outcome Date GA Lbr Len/2nd Weight Sex Delivery Anes PTL Lv  2 Current           1 TAB 03/30/15            Past Medical History:  Diagnosis Date  . Germ cell tumor of ovary   . Migraines     Past Surgical History:  Procedure Laterality Date  . OVARY SURGERY      Current Outpatient Medications on File Prior to Visit  Medication Sig Dispense Refill  . Prenatal Vit-Fe Fumarate-FA (PRENATAL VITAMIN PO) Take by mouth.     No current facility-administered medications on file prior to visit.    No Known Allergies  Social History   Socioeconomic History  . Marital status: Single    Spouse name: Not on file  . Number of children: Not on file  . Years of education: Not on file  . Highest education level: Not on file  Occupational History  . Not on file  Tobacco Use  . Smoking status: Never Smoker  . Smokeless tobacco: Never Used  Substance and Sexual Activity  . Alcohol use: Not Currently  . Drug use:  Never  . Sexual activity: Yes    Partners: Male  Other Topics Concern  . Not on file  Social History Narrative  . Not on file   Social Determinants of Health   Financial Resource Strain:   . Difficulty of Paying Living Expenses: Not on file  Food Insecurity:   . Worried About Charity fundraiser in the Last Year: Not on file  . Ran Out of Food in the Last Year: Not on file  Transportation Needs:   . Lack of Transportation (Medical): Not on file  . Lack of Transportation (Non-Medical): Not on file  Physical Activity:   . Days of Exercise per Week: Not on file  . Minutes of Exercise per Session: Not on file  Stress:   . Feeling of Stress : Not on file  Social Connections:   . Frequency of Communication with Friends and Family: Not on file  . Frequency of Social Gatherings with Friends and Family: Not on file  . Attends Religious Services: Not on file  . Active Member of Clubs or Organizations: Not on file  . Attends Archivist Meetings: Not on file  . Marital Status: Not on file  Intimate Partner Violence:   . Fear of Current or  Ex-Partner: Not on file  . Emotionally Abused: Not on file  . Physically Abused: Not on file  . Sexually Abused: Not on file    Family History  Problem Relation Age of Onset  . Diabetes Mother   . Migraines Mother   . Diabetes Maternal Grandfather     The following portions of the patient's history were reviewed and updated as appropriate: allergies, current medications, past OB history, past medical history, past surgical history, past family history, past social history, and problem list.  Review of Systems:  ROS negative except as noted above. Information obtained from patient.   OBJECTIVE:  BP 114/62   Pulse 94   Wt 165 lb 5 oz (75 kg)   LMP 07/22/2019   BMI 28.38 kg/m   Initial Physical Exam (New OB)  GENERAL APPEARANCE: alert, well appearing, in no apparent distress  HEAD: normocephalic, atraumatic  MOUTH: mucous  membranes moist, pharynx normal without lesions  THYROID: no thyromegaly or masses present  BREASTS: no masses noted, no significant tenderness, no palpable axillary nodes, no skin changes  LUNGS: clear to auscultation, no wheezes, rales or rhonchi, symmetric air entry  HEART: regular rate and rhythm, no murmurs  ABDOMEN: soft, nontender, nondistended, no abnormal masses, no epigastric pain, FHT present and skin scar: midline  EXTREMITIES: no redness or tenderness in the calves or thighs, no edema  SKIN: normal coloration and turgor, no rashes  LYMPH NODES: no adenopathy palpable  NEUROLOGIC: alert, oriented, normal speech, no focal findings or movement disorder noted  PELVIC EXAM EXTERNAL GENITALIA: normal appearing vulva with no masses, tenderness or lesions VAGINA: no abnormal discharge or lesions CERVIX: no lesions or cervical motion tenderness and Pap collected  ASSESSMENT: Normal pregnancy Nausea without vomiting in pregnancy UTI symptoms-Rx Macrobid Screening cervical cancer with history of abnormal pap-Pap today History germ cell tumor Family history GDM/Type 2 DM-Early glucola next visit Family history thyroid disease-TSH next visit  PLAN: Prenatal care New OB counseling: The patient has been given an overview regarding routine prenatal care. Recommendations regarding diet, weight gain, and exercise in pregnancy were given. Prenatal testing, optional genetic testing, and ultrasound use in pregnancy were reviewed.  Benefits of Breast Feeding were discussed. The patient is encouraged to consider nursing her baby post partum. See orders

## 2019-10-22 ENCOUNTER — Encounter: Payer: 59 | Admitting: Obstetrics and Gynecology

## 2019-10-22 LAB — CBC
Hematocrit: 37.3 % (ref 34.0–46.6)
Hemoglobin: 12.2 g/dL (ref 11.1–15.9)
MCH: 30 pg (ref 26.6–33.0)
MCHC: 32.7 g/dL (ref 31.5–35.7)
MCV: 92 fL (ref 79–97)
Platelets: 342 10*3/uL (ref 150–450)
RBC: 4.07 x10E6/uL (ref 3.77–5.28)
RDW: 12.8 % (ref 11.7–15.4)
WBC: 7.7 10*3/uL (ref 3.4–10.8)

## 2019-10-22 LAB — DRUG PROFILE, UR, 9 DRUGS (LABCORP)
Amphetamines, Urine: NEGATIVE ng/mL
Barbiturate Quant, Ur: NEGATIVE ng/mL
Benzodiazepine Quant, Ur: NEGATIVE ng/mL
Cannabinoid Quant, Ur: NEGATIVE ng/mL
Cocaine (Metab.): NEGATIVE ng/mL
Methadone Screen, Urine: NEGATIVE ng/mL
Opiate Quant, Ur: NEGATIVE ng/mL
PCP Quant, Ur: NEGATIVE ng/mL
Propoxyphene: NEGATIVE ng/mL

## 2019-10-22 LAB — SPECIMEN STATUS REPORT

## 2019-10-22 LAB — NICOTINE SCREEN, URINE: Cotinine Ql Scrn, Ur: NEGATIVE ng/mL

## 2019-10-23 LAB — CYTOLOGY - PAP: Diagnosis: NEGATIVE

## 2019-10-31 ENCOUNTER — Telehealth: Payer: Self-pay

## 2019-10-31 NOTE — Telephone Encounter (Signed)
Received patient message. Pt was wondering if her partner was able to have genetic screening done here at the office.  Called Carin Beam from [employer] she explained to me that an order would need to be placed. She informed me that she would sign me up for a [employer] portal but it would take 24 hours. Advised me to ask Luana Shu to show me. Advised me that patient can schedule a date for a mobile phlebotomist to go to their home and do the partners screening. Denice Bors will send me an email with instructions for future reference.

## 2019-10-31 NOTE — Telephone Encounter (Signed)
Called patient per Dani Gobble, CNM to inform that her [facility] screening report results were in. 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 Informed pt her spouse will need carrier screening per Annia Friendly. Informed patient that if her spouse is also a carrier this means, their chance to have a child with this condition is 1 in 4 (25%).  Pt verbalized understanding. Pt asked about the gender results and I informed her I sent her a Pharmacist, community message. Pt requests results in an envelope and states her sister -Elvia Aydin will pick envelope up. Pt aware that sister will need to show ID.

## 2019-10-31 NOTE — Telephone Encounter (Signed)
Mychart message sent.

## 2019-11-01 ENCOUNTER — Telehealth: Payer: Self-pay

## 2019-11-01 NOTE — Telephone Encounter (Signed)
Called patient to obtain partners DOB and contact number for a genetic carrier screening order. Informed pt why I needed this information. Pt verbalized understanding.

## 2019-11-04 LAB — GC/CHLAMYDIA PROBE AMP
Chlamydia trachomatis, NAA: NEGATIVE
Neisseria Gonorrhoeae by PCR: NEGATIVE

## 2019-11-20 ENCOUNTER — Encounter: Payer: Self-pay | Admitting: Certified Nurse Midwife

## 2019-11-20 ENCOUNTER — Other Ambulatory Visit: Payer: 59

## 2019-11-20 ENCOUNTER — Ambulatory Visit (INDEPENDENT_AMBULATORY_CARE_PROVIDER_SITE_OTHER): Payer: 59 | Admitting: Certified Nurse Midwife

## 2019-11-20 ENCOUNTER — Other Ambulatory Visit: Payer: Self-pay

## 2019-11-20 VITALS — BP 106/71 | HR 83 | Wt 174.3 lb

## 2019-11-20 DIAGNOSIS — Z3A17 17 weeks gestation of pregnancy: Secondary | ICD-10-CM

## 2019-11-20 DIAGNOSIS — Z23 Encounter for immunization: Secondary | ICD-10-CM

## 2019-11-20 LAB — POCT URINALYSIS DIPSTICK OB
Bilirubin, UA: NEGATIVE
Blood, UA: NEGATIVE
Glucose, UA: NEGATIVE
Ketones, UA: NEGATIVE
Leukocytes, UA: NEGATIVE
Nitrite, UA: NEGATIVE
POC,PROTEIN,UA: NEGATIVE
Spec Grav, UA: 1.025 (ref 1.010–1.025)
Urobilinogen, UA: 0.2 E.U./dL
pH, UA: 5 (ref 5.0–8.0)

## 2019-11-20 NOTE — Progress Notes (Signed)
ROB doing well. Not feeling movement yet. Discussed round ligament pain and self help measures . She verbalizes understanding. Early glucose test today. Will follow up with results. Return in 3 wks for anatomy u/s and ROB with Sharyn Lull.   Philip Aspen, CNM

## 2019-11-20 NOTE — Patient Instructions (Signed)

## 2019-11-21 ENCOUNTER — Other Ambulatory Visit: Payer: Self-pay | Admitting: Certified Nurse Midwife

## 2019-11-21 DIAGNOSIS — O99810 Abnormal glucose complicating pregnancy: Secondary | ICD-10-CM

## 2019-11-21 LAB — GLUCOSE, 1 HOUR GESTATIONAL: Gestational Diabetes Screen: 155 mg/dL — ABNORMAL HIGH (ref 65–139)

## 2019-11-21 LAB — TSH: TSH: 0.656 u[IU]/mL (ref 0.450–4.500)

## 2019-12-11 ENCOUNTER — Other Ambulatory Visit: Payer: Self-pay

## 2019-12-11 ENCOUNTER — Ambulatory Visit: Payer: 59

## 2019-12-11 DIAGNOSIS — Z3A17 17 weeks gestation of pregnancy: Secondary | ICD-10-CM

## 2019-12-13 ENCOUNTER — Encounter: Payer: Self-pay | Admitting: Certified Nurse Midwife

## 2019-12-13 ENCOUNTER — Ambulatory Visit (INDEPENDENT_AMBULATORY_CARE_PROVIDER_SITE_OTHER): Payer: 59 | Admitting: Certified Nurse Midwife

## 2019-12-13 ENCOUNTER — Other Ambulatory Visit: Payer: Self-pay

## 2019-12-13 ENCOUNTER — Other Ambulatory Visit: Payer: 59

## 2019-12-13 VITALS — BP 102/68 | Wt 175.2 lb

## 2019-12-13 DIAGNOSIS — Z3482 Encounter for supervision of other normal pregnancy, second trimester: Secondary | ICD-10-CM

## 2019-12-13 DIAGNOSIS — Z3A20 20 weeks gestation of pregnancy: Secondary | ICD-10-CM

## 2019-12-13 DIAGNOSIS — O99810 Abnormal glucose complicating pregnancy: Secondary | ICD-10-CM

## 2019-12-13 LAB — POCT URINALYSIS DIPSTICK OB
Bilirubin, UA: NEGATIVE
Blood, UA: NEGATIVE
Glucose, UA: NEGATIVE
Ketones, UA: NEGATIVE
Nitrite, UA: NEGATIVE
POC,PROTEIN,UA: NEGATIVE
Spec Grav, UA: 1.01 (ref 1.010–1.025)
Urobilinogen, UA: 0.2 E.U./dL
pH, UA: 7 (ref 5.0–8.0)

## 2019-12-13 NOTE — Progress Notes (Signed)
ROB-Reports round ligament pain and occasional sharp abdominal pains. Discussed home treatment measures including use of abdominal support, handouts provided. Anatomy scan normal but incomplete; see findings below, patient verbalized understanding. Follow up ultrasound scheduled for next Wednesday. GTT today, see orders. Anticipatory guidance regarding course of prenatal care. Reviewed red flag symptoms and when to call. RTC x 4 weeks for ROB or sooner if needed.   ULTRASOUND REPORT  Location: [facility] Date of Service: 12/11/2019   Indications:Anatomy Ultrasound Findings:  Singleton intrauterine pregnancy is visualized with FHR at 145 BPM. Biometrics give an (U/S) Gestational age of [redacted]w[redacted]d and an (U/S) EDD of 02/28/20223; this correlates with the clinically established Estimated Date of Delivery: 04/27/20  Fetal presentation is transverse.  EFW: 352 g ( 12 oz). Fetal Percentile  Placenta: posterior. Grade: 1 AFI: subjectively normal.  Anatomic survey is incomplete for Nose lips and normal; Gender - female.    Right Ovary is normal in appearance. Left Ovary is normal appearance. Survey of the adnexa demonstrates no adnexal masses. There is no free peritoneal fluid in the cul de sac.  Impression: 1. [redacted]w[redacted]d Viable Singleton Intrauterine pregnancy by U/S. 2. (U/S) EDD is consistent with Clinically established Estimated Date of Delivery: 04/27/20 . 3. Incomplete for nose/ lips.  Recommendations: 1.Clinical correlation with the patient's History and Physical Exam.

## 2019-12-13 NOTE — Patient Instructions (Addendum)
Healthy Weight Gain During Pregnancy, Adult A certain amount of weight gain during pregnancy is normal and healthy. How much weight you should gain depends on your overall health and a measurement called BMI (body mass index). BMI is an estimate of your body fat based on your height and weight. You can use an online calculator to figure out your BMI, or you can ask your health care provider to calculate it for you at your next visit. Your recommended pregnancy weight gain is based on your pre-pregnancy BMI. General guidelines for a healthy total weight gain during pregnancy are listed below. If your BMI at or before the start of your pregnancy is:  Less than 18.5 (underweight), you should gain 28-40 lb (13-18 kg).  18.5-24.9 (normal weight), you should gain 25-35 lb (11-16 kg).  25-29.9 (overweight), you should gain 15-25 lb (7-11 kg).  30 or higher (obese), you should gain 11-20 lb (5-9 kg). These ranges vary depending on your individual health. If you are carrying more than one baby (multiples), it may be safe to gain more weight than these recommendations. If you gain less weight than recommended, that may be safe as long as your baby is growing and developing normally. How can unhealthy weight gain affect me and my baby? Gaining too much weight during pregnancy can lead to pregnancy complications, such as:  A temporary form of diabetes that develops during pregnancy (gestational diabetes).  High blood pressure during pregnancy and protein in your urine (preeclampsia).  High blood pressure during pregnancy without protein in your urine (gestational hypertension).  Your baby having a high weight at birth, which may: ? Raise your risk of having a more difficult delivery or a surgical delivery (cesarean delivery, or C-section). ? Raise your child's risk of developing obesity during childhood. Not gaining enough weight can be life-threatening for your baby, and it may raise your baby's chances  of:  Being born early (preterm).  Growing more slowly than normal during pregnancy (growth restriction).  Having a low weight at birth. What actions can I take to gain a healthy amount of weight during pregnancy? General instructions  Keep track of your weight gain during pregnancy.  Take over-the-counter and prescription medicines only as told by your health care provider. Take all prenatal supplements as directed.  Keep all health care visits during pregnancy (prenatal visits). These visits are a good time to discuss your weight gain. Your health care provider will weigh you at each visit to make sure you are gaining a healthy amount of weight. Nutrition   Eat a balanced, nutrient-rich diet. Eat plenty of: ? Fruits and vegetables, such as berries and broccoli. ? Whole grains, such as millet, barley, whole-wheat breads and cereals, and oatmeal. ? Low-fat dairy products or non-dairy products such as almond milk or rice milk. ? Protein foods, such as lean meat, chicken, eggs, and legumes (such as peas, beans, soybeans, and lentils).  Avoid foods that are fried or have a lot of fat, salt (sodium), or sugar.  Drink enough fluid to keep your urine pale yellow.  Choose healthy snack and drink options when you are at work or on the go: ? Drink water. Avoid soda, sports drinks, and juices that have added sugar. ? Avoid drinks with caffeine, such as coffee and energy drinks. ? Eat snacks that are high in protein, such as nuts, protein bars, and low-fat yogurt. ? Carry convenient snacks in your purse that do not need refrigeration, such as a pack of   trail mix, an apple, or a granola bar.  If you need help improving your diet, work with a health care provider or a diet and nutrition specialist (dietitian). Activity   Exercise regularly, as told by your health care provider. ? If you were active before becoming pregnant, you may be able to continue your regular fitness activities. ? If  you were not active before pregnancy, you may gradually build up to exercising for 30 or more minutes on most days of the week. This may include walking, swimming, or yoga.  Ask your health care provider what activities are safe for you. Talk with your health care provider about whether you may need to be excused from certain school or work activities. Where to find more information Learn more about managing your weight gain during pregnancy from:  American Pregnancy Association: www.americanpregnancy.org  [place] Department of Agriculture pregnancy weight gain calculator: www.choosemyplate.gov Summary  Too much weight gain during pregnancy can lead to complications for you and your baby.  Find out your pre-pregnancy BMI to determine how much weight gain is healthy for you.  Eat nutritious foods and stay active.  Keep all of your prenatal visits as told by your health care provider. This information is not intended to replace advice given to you by your health care provider. Make sure you discuss any questions you have with your health care provider. Document Revised: 11/07/2018 Document Reviewed: 11/04/2016 Elsevier Patient Education  2020 [employer]   Common Medications Safe in Pregnancy  Acne:      Constipation:  Benzoyl Peroxide     Colace  Clindamycin      Dulcolax Suppository  [place]ica Erythromycin     Fibercon  Salicylic Acid      Metamucil         Miralax AVOID:        Senakot   Accutane    Cough:  Retin-A       Cough Drops  Tetracycline      Phenergan w/ Codeine if Rx  Minocycline      Robitussin (Plain & DM)  Antibiotics:     Crabs/Lice:  Ceclor       RID  Cephalosporins    AVOID:  E-Mycins      Kwell  Keflex  Macrobid/Macrodantin   Diarrhea:  Penicillin      Kao-Pectate  Zithromax      Imodium AD         PUSH FLUIDS AVOID:       Cipro     Fever:  Tetracycline      Tylenol (Regular or Extra  Minocycline       Strength)  Levaquin      Extra Strength-Do not              Exceed 8 tabs/24 hrs Caffeine:        <200mg/day (equiv. To 1 cup of coffee or  approx. 3 12 oz sodas)         Gas: Cold/Hayfever:       Gas-X  Benadryl      Mylicon  Claritin       Phazyme  **Claritin-D        Chlor-Trimeton    Headaches:  Dimetapp      ASA-Free Excedrin  Drixoral-Non-Drowsy     Cold Compress  Mucinex (Guaifenasin)     Tylenol (Regular or Extra  Sudafed/Sudafed-12 Hour     Strength)  **Sudafed PE Pseudoephedrine   Tylenol Cold & Sinus       Vicks Vapor Rub  Zyrtec  **AVOID if Problems With Blood Pressure         Heartburn: Avoid lying down for at least 1 hour after meals  Aciphex      Maalox     Rash:  Milk of Magnesia     Benadryl    Mylanta       1% Hydrocortisone Cream  Pepcid  Pepcid Complete   Sleep Aids:  Prevacid      Ambien   Prilosec       Benadryl  Rolaids       Chamomile Tea  Tums (Limit 4/day)     Unisom         Tylenol PM         Warm milk-add vanilla or  Hemorrhoids:       Sugar for taste  Anusol/Anusol H.C.  (RX: Analapram 2.5%)  Sugar Substitutes:  Hydrocortisone OTC     Ok in moderation  Preparation H      Tucks        Vaseline lotion applied to tissue with wiping    Herpes:     Throat:  Acyclovir      Oragel  Famvir  Valtrex     Vaccines:         Flu Shot Leg Cramps:       *Gardasil  Benadryl      Hepatitis A         Hepatitis B Nasal Spray:       Pneumovax  Saline Nasal Spray     Polio Booster         Tetanus Nausea:       Tuberculosis test or PPD  Vitamin B6 25 mg TID   AVOID:    Dramamine      *Gardasil  Emetrol       Live Poliovirus  Ginger Root 250 mg QID    MMR (measles, mumps &  High Complex Carbs @ Bedtime    rebella)  Sea Bands-Accupressure    Varicella (Chickenpox)  Unisom 1/2 tab TID     *No known complications           If received before Pain:         Known pregnancy;   Darvocet       Resume series after  Lortab        Delivery  Percocet    Yeast:   Tramadol      Femstat  Tylenol  3      Gyne-lotrimin  Ultram       Monistat  Vicodin           MISC:         All Sunscreens           Hair Coloring/highlights          Insect Repellant's          (Including DEET)         Mystic Tans   Back Pain in Pregnancy Back pain during pregnancy is common. Back pain may be caused by several factors that are related to changes during your pregnancy. Follow these instructions at home: Managing pain, stiffness, and swelling      If directed, for sudden (acute) back pain, put ice on the painful area. ? Put ice in a plastic bag. ? Place a towel between your skin and the bag. ? Leave the ice on for 20 minutes, 2-3 times per day.  If directed, apply heat  to the affected area before you exercise. Use the heat source that your health care provider recommends, such as a moist heat pack or a heating pad. ? Place a towel between your skin and the heat source. ? Leave the heat on for 20-30 minutes. ? Remove the heat if your skin turns bright red. This is especially important if you are unable to feel pain, heat, or cold. You may have a greater risk of getting burned.  If directed, massage the affected area. Activity  Exercise as told by your health care provider. Gentle exercise is the best way to prevent or manage back pain.  Listen to your body when lifting. If lifting hurts, ask for help or bend your knees. This uses your leg muscles instead of your back muscles.  Squat down when picking up something from the floor. Do not bend over.  Only use bed rest for short periods as told by your health care provider. Bed rest should only be used for the most severe episodes of back pain. Standing, sitting, and lying down  Do not stand in one place for long periods of time.  Use good posture when sitting. Make sure your head rests over your shoulders and is not hanging forward. Use a pillow on your lower back if necessary.  Try sleeping on your side, preferably the left side, with a  pregnancy support pillow or 1-2 regular pillows between your legs. ? If you have back pain after a night's rest, your bed may be too soft. ? A firm mattress may provide more support for your back during pregnancy. General instructions  Do not wear high heels.  Eat a healthy diet. Try to gain weight within your health care provider's recommendations.  Use a maternity girdle, elastic sling, or back brace as told by your health care provider.  Take over-the-counter and prescription medicines only as told by your health care provider.  Work with a physical therapist or massage therapist to find ways to manage back pain. Acupuncture or massage therapy may be helpful.  Keep all follow-up visits as told by your health care provider. This is important. Contact a health care provider if:  Your back pain interferes with your daily activities.  You have increasing pain in other parts of your body. Get help right away if:  You develop numbness, tingling, weakness, or problems with the use of your arms or legs.  You develop severe back pain that is not controlled with medicine.  You have a change in bowel or bladder control.  You develop shortness of breath, dizziness, or you faint.  You develop nausea, vomiting, or sweating.  You have back pain that is a rhythmic, cramping pain similar to labor pains. Labor pain is usually 1-2 minutes apart, lasts for about 1 minute, and involves a bearing down feeling or pressure in your pelvis.  You have back pain and your water breaks or you have vaginal bleeding.  You have back pain or numbness that travels down your leg.  Your back pain developed after you fell.  You develop pain on one side of your back.  You see blood in your urine.  You develop skin blisters in the area of your back pain. Summary  Back pain may be caused by several factors that are related to changes during your pregnancy.  Follow instructions as told by your health care  provider for managing pain, stiffness, and swelling.  Exercise as told by your health care provider. Gentle exercise  is the best way to prevent or manage back pain.  Take over-the-counter and prescription medicines only as told by your health care provider.  Keep all follow-up visits as told by your health care provider. This is important. This information is not intended to replace advice given to you by your health care provider. Make sure you discuss any questions you have with your health care provider. Document Revised: 06/05/2018 Document Reviewed: 08/02/2017 Elsevier Patient Education  [place] of Pregnancy  The second trimester is from week 14 through week 27 (month 4 through 6). This is often the time in pregnancy that you feel your best. Often times, morning sickness has lessened or quit. You may have more energy, and you may get hungry more often. Your unborn baby is growing rapidly. At the end of the sixth month, he or she is about 9 inches long and weighs about 1 pounds. You will likely feel the baby move between 18 and 20 weeks of pregnancy. Follow these instructions at home: Medicines  Take over-the-counter and prescription medicines only as told by your doctor. Some medicines are safe and some medicines are not safe during pregnancy.  Take a prenatal vitamin that contains at least 600 micrograms (mcg) of folic acid.  If you have trouble pooping (constipation), take medicine that will make your stool soft (stool softener) if your doctor approves. Eating and drinking   Eat regular, healthy meals.  Avoid raw meat and uncooked cheese.  If you get low calcium from the food you eat, talk to your doctor about taking a daily calcium supplement.  Avoid foods that are high in fat and sugars, such as fried and sweet foods.  If you feel sick to your stomach (nauseous) or throw up (vomit): ? Eat 4 or 5 small meals a day instead of 3 large  meals. ? Try eating a few soda crackers. ? Drink liquids between meals instead of during meals.  To prevent constipation: ? Eat foods that are high in fiber, like fresh fruits and vegetables, whole grains, and beans. ? Drink enough fluids to keep your pee (urine) clear or pale yellow. Activity  Exercise only as told by your doctor. Stop exercising if you start to have cramps.  Do not exercise if it is too hot, too humid, or if you are in a place of great height (high altitude).  Avoid heavy lifting.  Wear low-heeled shoes. Sit and stand up straight.  You can continue to have sex unless your doctor tells you not to. Relieving pain and discomfort  Wear a good support bra if your breasts are tender.  Take warm water baths (sitz baths) to soothe pain or discomfort caused by hemorrhoids. Use hemorrhoid cream if your doctor approves.  Rest with your legs raised if you have leg cramps or low back pain.  If you develop puffy, bulging veins (varicose veins) in your legs: ? Wear support hose or compression stockings as told by your doctor. ? Raise (elevate) your feet for 15 minutes, 3-4 times a day. ? Limit salt in your food. Prenatal care  Write down your questions. Take them to your prenatal visits.  Keep all your prenatal visits as told by your doctor. This is important. Safety  Wear your seat belt when driving.  Make a list of emergency phone numbers, including numbers for family, friends, the hospital, and police and fire departments. General instructions  Ask your doctor about the right foods to eat or  for help finding a counselor, if you need these services.  Ask your doctor about local prenatal classes. Begin classes before month 6 of your pregnancy.  Do not use hot tubs, steam rooms, or saunas.  Do not douche or use tampons or scented sanitary pads.  Do not cross your legs for long periods of time.  Visit your dentist if you have not done so. Use a soft toothbrush  to brush your teeth. Floss gently.  Avoid all smoking, herbs, and alcohol. Avoid drugs that are not approved by your doctor.  Do not use any products that contain nicotine or tobacco, such as cigarettes and e-cigarettes. If you need help quitting, ask your doctor.  Avoid cat litter boxes and soil used by cats. These carry germs that can cause birth defects in the baby and can cause a loss of your baby (miscarriage) or stillbirth. Contact a doctor if:  You have mild cramps or pressure in your lower belly.  You have pain when you pee (urinate).  You have bad smelling fluid coming from your vagina.  You continue to feel sick to your stomach (nauseous), throw up (vomit), or have watery poop (diarrhea).  You have a nagging pain in your belly area.  You feel dizzy. Get help right away if:  You have a fever.  You are leaking fluid from your vagina.  You have spotting or bleeding from your vagina.  You have severe belly cramping or pain.  You lose or gain weight rapidly.  You have trouble catching your breath and have chest pain.  You notice sudden or extreme puffiness (swelling) of your face, hands, ankles, feet, or legs.  You have not felt the baby move in over an hour.  You have severe headaches that do not go away when you take medicine.  You have trouble seeing. Summary  The second trimester is from week 14 through week 27 (months 4 through 6). This is often the time in pregnancy that you feel your best.  To take care of yourself and your unborn baby, you will need to eat healthy meals, take medicines only if your doctor tells you to do so, and do activities that are safe for you and your baby.  Call your doctor if you get sick or if you notice anything unusual about your pregnancy. Also, call your doctor if you need help with the right food to eat, or if you want to know what activities are safe for you. This information is not intended to replace advice given to you by  your health care provider. Make sure you discuss any questions you have with your health care provider. Document Revised: 06/08/2018 Document Reviewed: 03/22/2016 Elsevier Patient Education  2020 [place]. 12/18/2019

## 2019-12-14 LAB — GESTATIONAL GLUCOSE TOLERANCE
Glucose, Fasting: 89 mg/dL (ref 65–94)
Glucose, GTT - 1 Hour: 147 mg/dL (ref 65–179)
Glucose, GTT - 2 Hour: 106 mg/dL (ref 65–154)
Glucose, GTT - 3 Hour: 88 mg/dL (ref 65–139)

## 2019-12-18 ENCOUNTER — Ambulatory Visit (INDEPENDENT_AMBULATORY_CARE_PROVIDER_SITE_OTHER): Payer: 59

## 2019-12-18 ENCOUNTER — Other Ambulatory Visit: Payer: Self-pay

## 2019-12-18 ENCOUNTER — Other Ambulatory Visit: Payer: Self-pay | Admitting: Certified Nurse Midwife

## 2019-12-18 ENCOUNTER — Telehealth: Payer: Self-pay

## 2019-12-18 DIAGNOSIS — Z09 Encounter for follow-up examination after completed treatment for conditions other than malignant neoplasm: Secondary | ICD-10-CM

## 2019-12-18 NOTE — Telephone Encounter (Signed)
Dental clearance letter faxed per patient request. [phone] mychart message sent to patient

## 2019-12-18 NOTE — Telephone Encounter (Signed)
Letter sent.

## 2019-12-19 ENCOUNTER — Telehealth: Payer: Self-pay

## 2019-12-19 NOTE — Telephone Encounter (Signed)
mychart message sent to patient

## 2020-01-14 ENCOUNTER — Ambulatory Visit (INDEPENDENT_AMBULATORY_CARE_PROVIDER_SITE_OTHER): Payer: 59 | Admitting: Certified Nurse Midwife

## 2020-01-14 ENCOUNTER — Encounter: Payer: Self-pay | Admitting: Certified Nurse Midwife

## 2020-01-14 ENCOUNTER — Encounter: Payer: 59 | Admitting: Certified Nurse Midwife

## 2020-01-14 ENCOUNTER — Other Ambulatory Visit: Payer: Self-pay

## 2020-01-14 VITALS — BP 117/65 | HR 90

## 2020-01-14 DIAGNOSIS — Z3482 Encounter for supervision of other normal pregnancy, second trimester: Secondary | ICD-10-CM

## 2020-01-14 DIAGNOSIS — Z3A25 25 weeks gestation of pregnancy: Secondary | ICD-10-CM | POA: Diagnosis not present

## 2020-01-14 NOTE — Patient Instructions (Signed)
Td (Tetanus, Diphtheria) Vaccine: What You Need to Know 1. Why get vaccinated? Td vaccine can prevent tetanus and diphtheria. Tetanus enters the body through cuts or wounds. Diphtheria spreads from person to person.  TETANUS (T) causes painful stiffening of the muscles. Tetanus can lead to serious health problems, including being unable to open the mouth, having trouble swallowing and breathing, or death.  DIPHTHERIA (D) can lead to difficulty breathing, heart failure, paralysis, or death. 2. Td vaccine Td is only for children 7 years and older, adolescents, and adults.  Td is usually given as a booster dose every 10 years, but it can also be given earlier after a severe and dirty wound or burn. Another vaccine, called Tdap, that protects against pertussis, also known as "whooping cough," in addition to tetanus and diphtheria, may be used instead of Td.  Td may be given at the same time as other vaccines. 3. Talk with your health care provider Tell your vaccine provider if the person getting the vaccine:  Has had an allergic reaction after a previous dose of any vaccine that protects against tetanus or diphtheria, or has any severe, life-threatening allergies.  Has ever had Guillain-Barr Syndrome (also called GBS).  Has had severe pain or swelling after a previous dose of any vaccine that protects against tetanus or diphtheria. In some cases, your health care provider may decide to postpone Td vaccination to a future visit.  People with minor illnesses, such as a cold, may be vaccinated. People who are moderately or severely ill should usually wait until they recover before getting Td vaccine.  Your health care provider can give you more information. 4. Risks of a vaccine reaction  Pain, redness, or swelling where the shot was given, mild fever, headache, feeling tired, and nausea, vomiting, diarrhea, or stomachache sometimes happen after Td vaccine. People sometimes faint after medical  procedures, including vaccination. Tell your provider if you feel dizzy or have vision changes or ringing in the ears.  As with any medicine, there is a very remote chance of a vaccine causing a severe allergic reaction, other serious injury, or death. 5. What if there is a serious problem? An allergic reaction could occur after the vaccinated person leaves the clinic. If you see signs of a severe allergic reaction (hives, swelling of the face and throat, difficulty breathing, a fast heartbeat, dizziness, or weakness), call 9-1-1 and get the person to the nearest hospital.  For other signs that concern you, call your health care provider.  Adverse reactions should be reported to the Vaccine Adverse Event Reporting System (VAERS). Your health care provider will usually file this report, or you can do it yourself. Visit the VAERS website at www.vaers.hhs.gov or call 1-800-822-7967. VAERS is only for reporting reactions, and VAERS staff do not give medical advice. 6. The National Vaccine Injury Compensation Program The National Vaccine Injury Compensation Program (VICP) is a federal program that was created to compensate people who may have been injured by certain vaccines. Visit the VICP website at www.hrsa.gov/vaccinecompensation or call 1-800-338-2382 to learn about the program and about filing a claim. There is a time limit to file a claim for compensation. 7. How can I learn more?  Ask your health care provider.  Call your local or state health department.  Contact the Centers for Disease Control and Prevention (CDC): ? Call 1-800-232-4636 (1-800-CDC-INFO) or ? Visit CDC's website at www.cdc.gov/vaccines Vaccine Information Statement Td Vaccine (05/30/18) This information is not intended to replace advice given   to you by your health care provider. Make sure you discuss any questions you have with your health care provider. Document Revised: 07/09/2018 Document Reviewed: 06/11/2018 Elsevier  Patient Education  [place]. Glucose Tolerance Test During Pregnancy Why am I having this test? The glucose tolerance test (GTT) is done to check how your body processes sugar (glucose). This is one of several tests used to diagnose diabetes that develops during pregnancy (gestational diabetes mellitus). Gestational diabetes is a temporary form of diabetes that some women develop during pregnancy. It usually occurs during the second trimester of pregnancy and goes away after delivery. Testing (screening) for gestational diabetes usually occurs between 24 and 28 weeks of pregnancy. You may have the GTT test after having a 1-hour glucose screening test if the results from that test indicate that you may have gestational diabetes. You may also have this test if:  You have a history of gestational diabetes.  You have a history of giving birth to very large babies or have experienced repeated fetal loss (stillbirth).  You have signs and symptoms of diabetes, such as: ? Changes in your vision. ? Tingling or numbness in your hands or feet. ? Changes in hunger, thirst, and urination that are not otherwise explained by your pregnancy. What is being tested? This test measures the amount of glucose in your blood at different times during a period of 3 hours. This indicates how well your body is able to process glucose. What kind of sample is taken?  Blood samples are required for this test. They are usually collected by inserting a needle into a blood vessel. How do I prepare for this test?  For 3 days before your test, eat normally. Have plenty of carbohydrate-rich foods.  Follow instructions from your health care provider about: ? Eating or drinking restrictions on the day of the test. You may be asked to not eat or drink anything other than water (fast) starting 8-10 hours before the test. ? Changing or stopping your regular medicines. Some medicines may interfere with this test. Tell a  health care provider about:  All medicines you are taking, including vitamins, herbs, eye drops, creams, and over-the-counter medicines.  Any blood disorders you have.  Any surgeries you have had.  Any medical conditions you have. What happens during the test? First, your blood glucose will be measured. This is referred to as your fasting blood glucose, since you fasted before the test. Then, you will drink a glucose solution that contains a certain amount of glucose. Your blood glucose will be measured again 1, 2, and 3 hours after drinking the solution. This test takes about 3 hours to complete. You will need to stay at the testing location during this time. During the testing period:  Do not eat or drink anything other than the glucose solution.  Do not exercise.  Do not use any products that contain nicotine or tobacco, such as cigarettes and e-cigarettes. If you need help stopping, ask your health care provider. The testing procedure may vary among health care providers and hospitals. How are the results reported? Your results will be reported as milligrams of glucose per deciliter of blood (mg/dL) or millimoles per liter (mmol/L). Your health care provider will compare your results to normal ranges that were established after testing a large group of people (reference ranges). Reference ranges may vary among labs and hospitals. For this test, common reference ranges are:  Fasting: less than 95-105 mg/dL (5.3-5.8 mmol/L).  1 hour  after drinking glucose: less than 180-190 mg/dL (10.0-10.5 mmol/L).  2 hours after drinking glucose: less than 155-165 mg/dL (8.6-9.2 mmol/L).  3 hours after drinking glucose: 140-145 mg/dL (7.8-8.1 mmol/L). What do the results mean? Results within reference ranges are considered normal, meaning that your glucose levels are well-controlled. If two or more of your blood glucose levels are high, you may be diagnosed with gestational diabetes. If only one  level is high, your health care provider may suggest repeat testing or other tests to confirm a diagnosis. Talk with your health care provider about what your results mean. Questions to ask your health care provider Ask your health care provider, or the department that is doing the test:  When will my results be ready?  How will I get my results?  What are my treatment options?  What other tests do I need?  What are my next steps? Summary  The glucose tolerance test (GTT) is one of several tests used to diagnose diabetes that develops during pregnancy (gestational diabetes mellitus). Gestational diabetes is a temporary form of diabetes that some women develop during pregnancy.  You may have the GTT test after having a 1-hour glucose screening test if the results from that test indicate that you may have gestational diabetes. You may also have this test if you have any symptoms or risk factors for gestational diabetes.  Talk with your health care provider about what your results mean. This information is not intended to replace advice given to you by your health care provider. Make sure you discuss any questions you have with your health care provider. Document Revised: 06/07/2018 Document Reviewed: 09/26/2016 Elsevier Patient Education  [place].

## 2020-01-14 NOTE — Progress Notes (Signed)
ROB doing well. Feels good movement. Discussed 28 wk labs and tdap next visit. She verbalizes understanding. Suggested doing 3 hr screen since she had to do for the early glucose screen. States that she moved to [facility] and is going to be changing practice closer to her home. She is encouraged to follow up with [place] until she establish a new provider. She verbalizes and agree. Follow up 3 wks if she has not started care at new practice.   Philip Aspen, CNM

## 2020-02-07 ENCOUNTER — Other Ambulatory Visit: Payer: 59

## 2020-02-07 ENCOUNTER — Encounter: Payer: 59 | Admitting: Certified Nurse Midwife

## 2021-07-02 ENCOUNTER — Other Ambulatory Visit: Payer: Self-pay

## 2021-07-02 ENCOUNTER — Emergency Department: Payer: BC Managed Care – PPO

## 2021-07-02 ENCOUNTER — Emergency Department
Admission: EM | Admit: 2021-07-02 | Discharge: 2021-07-03 | Disposition: A | Discharge status: Home / Self Care | Payer: BC Managed Care – PPO | Attending: Emergency Medicine | Admitting: Emergency Medicine

## 2021-07-02 DIAGNOSIS — D72829 Elevated white blood cell count, unspecified: Secondary | ICD-10-CM | POA: Diagnosis not present

## 2021-07-02 DIAGNOSIS — R590 Localized enlarged lymph nodes: Secondary | ICD-10-CM | POA: Diagnosis not present

## 2021-07-02 DIAGNOSIS — R591 Generalized enlarged lymph nodes: Secondary | ICD-10-CM

## 2021-07-02 DIAGNOSIS — R220 Localized swelling, mass and lump, head: Secondary | ICD-10-CM | POA: Diagnosis present

## 2021-07-02 LAB — COMPREHENSIVE METABOLIC PANEL
ALT: 22 U/L (ref 0–44)
AST: 17 U/L (ref 15–41)
Albumin: 4.4 g/dL (ref 3.5–5.0)
Alkaline Phosphatase: 96 U/L (ref 38–126)
Anion gap: 7 (ref 5–15)
BUN: 14 mg/dL (ref 6–20)
CO2: 25 mmol/L (ref 22–32)
Calcium: 9.2 mg/dL (ref 8.9–10.3)
Chloride: 105 mmol/L (ref 98–111)
Creatinine, Ser: 0.64 mg/dL (ref 0.44–1.00)
GFR, Estimated: >60 mL/min (ref >60)
Glucose, Bld: 97 mg/dL (ref 70–99)
Potassium: 4 mmol/L (ref 3.5–5.1)
Sodium: 137 mmol/L (ref 135–145)
Total Bilirubin: 0.9 mg/dL (ref 0.3–1.2)
Total Protein: 8.1 g/dL (ref 6.5–8.1)

## 2021-07-02 LAB — CBC WITH DIFFERENTIAL/PLATELET
Abs Immature Granulocytes: 0.03 10*3/uL (ref 0.00–0.07)
Basophils Absolute: 0.1 10*3/uL (ref 0.0–0.1)
Basophils Relative: 1 %
Eosinophils Absolute: 0.1 10*3/uL (ref 0.0–0.5)
Eosinophils Relative: 1 %
HCT: 38 % (ref 36.0–46.0)
Hemoglobin: 12.5 g/dL (ref 12.0–15.0)
Immature Granulocytes: 0 %
Lymphocytes Relative: 35 %
Lymphs Abs: 3.8 10*3/uL (ref 0.7–4.0)
MCH: 28.9 pg (ref 26.0–34.0)
MCHC: 32.9 g/dL (ref 30.0–36.0)
MCV: 87.8 fL (ref 80.0–100.0)
Monocytes Absolute: 0.7 10*3/uL (ref 0.1–1.0)
Monocytes Relative: 6 %
Neutro Abs: 6.3 10*3/uL (ref 1.7–7.7)
Neutrophils Relative %: 57 %
Platelets: 330 10*3/uL (ref 150–400)
RBC: 4.33 MIL/uL (ref 3.87–5.11)
RDW: 12.1 % (ref 11.5–15.5)
WBC: 10.9 10*3/uL — ABNORMAL HIGH (ref 4.0–10.5)
nRBC: 0 % (ref 0.0–0.2)

## 2021-07-02 MED ORDER — HYDROXYZINE PAMOATE 100 MG PO CAPS: 100.0000 mg | ORAL_CAPSULE | Freq: Three times a day (TID) | ORAL | 0 refills | Status: AC | PRN

## 2021-07-02 MED ORDER — HYDROXYZINE PAMOATE 100 MG PO CAPS: 100.0000 mg | ORAL_CAPSULE | Freq: Three times a day (TID) | ORAL | 0 refills | Status: DC | PRN

## 2021-07-02 MED ORDER — IOHEXOL 300 MG/ML  SOLN
75.0000 mL | Freq: Once | INTRAMUSCULAR | Status: AC | PRN
  Administered 2021-07-02: 75 mL via INTRAVENOUS
  Filled 2021-07-02: qty 75

## 2021-07-02 NOTE — Discharge Instructions (Addendum)
-  Follow-up with your primary care provider as needed. ? ?-You may take the hydroxyzine as needed for anxiety. ? ?-Return to the emergency department anytime if you begin to experience any new or worsening symptoms. ?

## 2021-07-02 NOTE — ED Provider Notes (Signed)
? ?[facility] ?Provider Note ? ? ? None  ?  (approximate) ? ? ?History  ? ?Chief Complaint ?Otalgia and Headache ? ? ?HPI ?Whitney Day is a 31 y.o. female, history of migraines, germ cell tumor, presents to the emergency department for evaluation of facial swelling.  Patient states that for the past 3 weeks she has been experiencing pain/swelling along the left side of her face, particular under the left mandible and around the left ear.  She states that she has a hard nodule underneath her left jaw which is painful for her.  Reports pain extending down into her neck as well.  Denies fever/chills, chest pain, shortness of breath, abdominal pain, flank pain, dysphagia, nausea/vomiting, diarrhea, urinary symptoms, vision changes, hearing changes, dizziness/lightheadedness, or no/tingling upper lower extremities.  ? ?History Limitations: No limitations. ? ?    ? ? ?Physical Exam  ?Triage Vital Signs: ?ED Triage Vitals  ?Enc Vitals Group  ?   BP 07/02/21 2007 114/77  ?   Pulse Rate 07/02/21 2007 85  ?   Resp 07/02/21 2007 16  ?   Temp 07/02/21 2007 98 ?F (36.7 ?C)  ?   Temp src --   ?   SpO2 07/02/21 2007 100 %  ?   Weight 07/02/21 2008 177 lb (80.3 kg)  ?   Height 07/02/21 2008 '5\' 4"'$  (1.626 m)  ?   Head Circumference --   ?   Peak Flow --   ?   Pain Score 07/02/21 2008 7  ?   Pain Loc --   ?   Pain Edu? --   ?   Excl. in [place]? --   ? ? ?Most recent vital signs: ?Vitals:  ? 07/02/21 2007 07/03/21 0012  ?BP: 114/77 121/75  ?Pulse: 85 81  ?Resp: 16 16  ?Temp: 98 ?F (36.7 ?C)   ?SpO2: 100% 100%  ? ? ?General: Awake, extremely anxious.  Tearful. ?Skin: Warm, dry. No rashes or lesions.  ?Eyes: PERRL. Conjunctivae normal.  ?CV: Good peripheral perfusion.  ?Resp: Normal effort.  ?Abd: Soft, non-tender. No distention.  ?Neuro: At baseline. No gross neurological deficits.  ? ?Focused Exam: No noticeable facial swelling present.  She does have submandibular lymphadenopathy present, as well as some  preauricular lymphadenopathy.  Throat exam unremarkable, no tonsillar swelling, exudates, or erythema.  Uvula midline.  No bony tenderness along the maxillofacial structures. ? ?Ear exam unremarkable bilaterally.  No bulging, erythema, or foreign bodies.  No erythema or tenderness around the mastoid process ? ?Dental exam unremarkable.  No evidence of erythema around the gumline or fluctuant abscesses.  No significant dental caries or fractures. ? ?Physical Exam ? ? ? ?ED Results / Procedures / Treatments  ?Labs ?(all labs ordered are listed, but only abnormal results are displayed) ?Labs Reviewed  ?CBC WITH DIFFERENTIAL/PLATELET - Abnormal; Notable for the following components:  ?    Result Value  ? WBC 10.9 (*)   ? All other components within normal limits  ?COMPREHENSIVE METABOLIC PANEL  ?POC URINE PREG, ED  ? ? ? ?EKG ?N/A. ? ? ?RADIOLOGY ? ?ED Provider Interpretation: I personally reviewed and interpreted this image, no acute abnormalities present. ? ?CT Soft Tissue Neck W Contrast ? ?Result Date: 07/02/2021 ?CLINICAL DATA:  Left-sided facial swelling EXAM: CT NECK WITH CONTRAST TECHNIQUE: Multidetector CT imaging of the neck was performed using the standard protocol following the bolus administration of intravenous contrast. RADIATION DOSE REDUCTION: This exam was performed according to the  departmental dose-optimization program which includes automated exposure control, adjustment of the mA and/or kV according to patient size and/or use of iterative reconstruction technique. CONTRAST:  59m OMNIPAQUE IOHEXOL 300 MG/ML  SOLN COMPARISON:  None Available. FINDINGS: PHARYNX AND LARYNX: The nasopharynx, oropharynx and larynx are normal. Visible portions of the oral cavity, tongue base and floor of mouth are normal. Normal epiglottis, vallecula and pyriform sinuses. The larynx is normal. No retropharyngeal abscess, effusion or lymphadenopathy. SALIVARY GLANDS: Normal parotid, submandibular and sublingual glands.  THYROID: Normal. LYMPH NODES: No enlarged or abnormal density lymph nodes. VASCULAR: Major cervical vessels are patent. LIMITED INTRACRANIAL: Normal. VISUALIZED ORBITS: Normal. MASTOIDS AND VISUALIZED PARANASAL SINUSES: No fluid levels or advanced mucosal thickening. No mastoid effusion. SKELETON: No bony spinal canal stenosis. No lytic or blastic lesions. UPPER CHEST: Clear. OTHER: None. IMPRESSION: Normal CT of the neck. Electronically Signed   By: KUlyses JarredM.D.   On: 07/02/2021 22:44   ? ?PROCEDURES: ? ?Critical Care performed: None. ? ?Procedures ? ? ? ?MEDICATIONS ORDERED IN ED: ?Medications  ?iohexol (OMNIPAQUE) 300 MG/ML solution 75 mL (75 mLs Intravenous Contrast Given 07/02/21 2231)  ? ? ? ?IMPRESSION / MDM / ASSESSMENT AND PLAN / ED COURSE  ?I reviewed the triage vital signs and the nursing notes. ?             ?               ? ?Differential diagnosis includes, but is not limited to, retropharyngeal abscess, dental infection/abscess, reactive lymphadenopathy, strep pharyngitis, otitis media ? ?ED Course ?Patient appears stable, but extremely anxious.  Vitals within normal limits. ? ?CBC shows mild leukocytosis at 10.9, otherwise unremarkable. ? ?CMP shows no evidence of kidney injury, transaminitis, or electrolyte abnormalities. ? ?Assessment/Plan ?Patient presents with concerns for facial/neck swelling.  I do appreciate some submandibular and preauricular lymphadenopathy, but otherwise physical exam is unremarkable.  Lab work-up reassuring.  CT scan of the neck shows no abnormal pathology.  Very low suspicion for any serious or life-threatening disorders at this time.  Unclear the etiology of the patient's lymphadenopathy, though it is been ongoing for the past several weeks and does not appear to be progressing.  I suspect that her symptoms are likely exacerbated by intense illness anxiety.  She states that she has a appointment scheduled within the next few weeks with a provider to discuss mental  health.  We will provide her with a prescription for hydroxyzine in the meantime.  We will plan to discharge. ? ?Considered admission for this patient, but given her stable presentation, unremarkable lab work-up, reassuring imaging, she is unlikely to benefit. ? ?Provided the patient with anticipatory guidance, return precautions, and educational material. Encouraged the patient to return to the emergency department at any time if they begin to experience any new or worsening symptoms. Patient expressed understanding and agreed with the plan.  ? ?  ? ? ?FINAL CLINICAL IMPRESSION(S) / ED DIAGNOSES  ? ?Final diagnoses:  ?Lymphadenopathy  ? ? ? ?Rx / DC Orders  ? ?ED Discharge Orders   ? ?      Ordered  ?  hydrOXYzine (VISTARIL) 100 MG capsule  3 times daily PRN,   Status:  Discontinued       ? 07/02/21 2321  ?  hydrOXYzine (VISTARIL) 100 MG capsule  3 times daily PRN       ? 07/02/21 2322  ? ?  ?  ? ?  ? ? ? ?Note:  This  document was prepared using Systems analyst and may include unintentional dictation errors. ?  ?Teodoro Spray, [place] ?07/03/21 0034 ? ?  ?Blake Divine, MD ?07/03/21 2350 ? ?

## 2021-07-02 NOTE — ED Triage Notes (Signed)
Pt complains of left sided ear ache and left sided facial swelling with associated headache for 3 days. Pt denies fever, states at times is having pain extending down lateral left neck, slight left lower jaw swelling noted.  ?

## 2021-07-03 NOTE — ED Notes (Signed)
E signature pad not working. Pt educated on discharge instructions and verbalized understanding.  

## 2022-08-25 IMAGING — CT CT NECK W/ CM
5 series · 16 of 35 positions shown, 18 images · IV contrast (agent unspecified)
Comparison: None Available.

CLINICAL DATA: Left-sided facial swelling

EXAM:
CT NECK WITH CONTRAST
TECHNIQUE: Multidetector CT imaging of the neck was performed using the
standard protocol following the bolus administration of intravenous
contrast.

[Series 2: axial neck · axial · 0.58mm/px · z∈[-7,+93]mm · 3 of 101 slices shown, 4 images]
[im 26/101  soft-tissue]
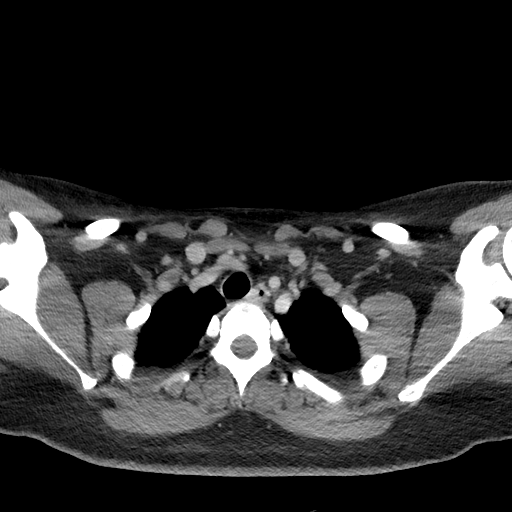
[im 26/101  bone]
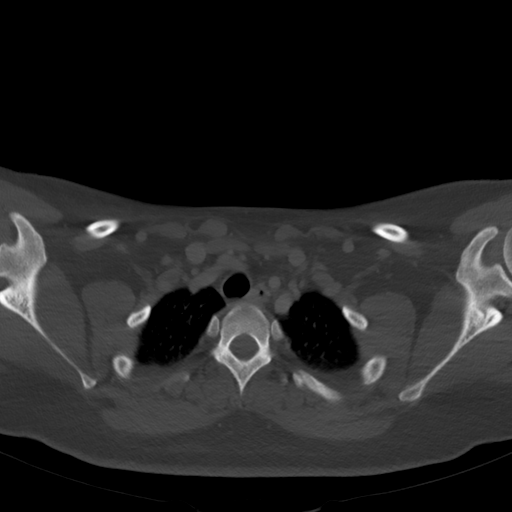
[im 51/101  bone]
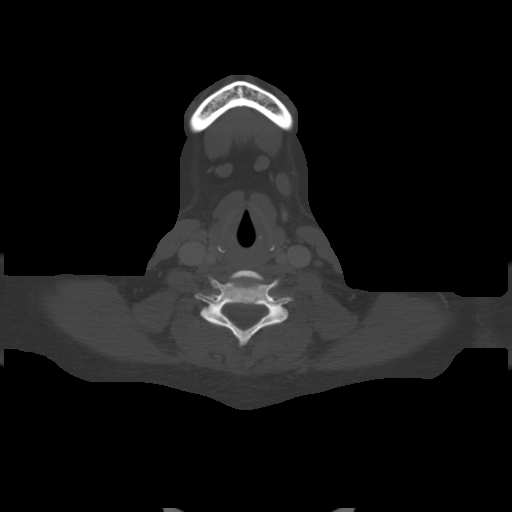
[im 76/101  bone]
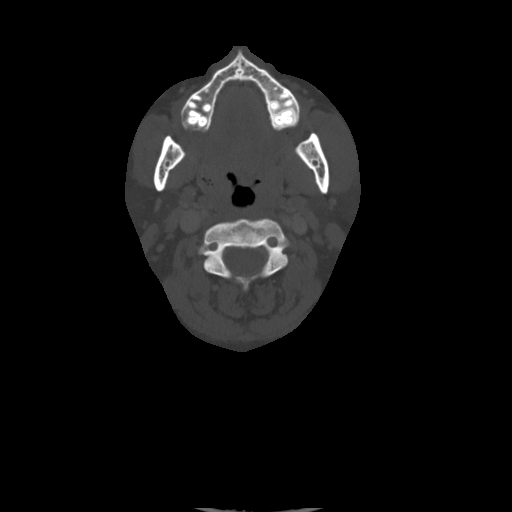

[Series 4: axial bone · axial · 0.58mm/px · z∈[+9,+75]mm · 2 of 101 slices shown]
[im 34/101  bone]
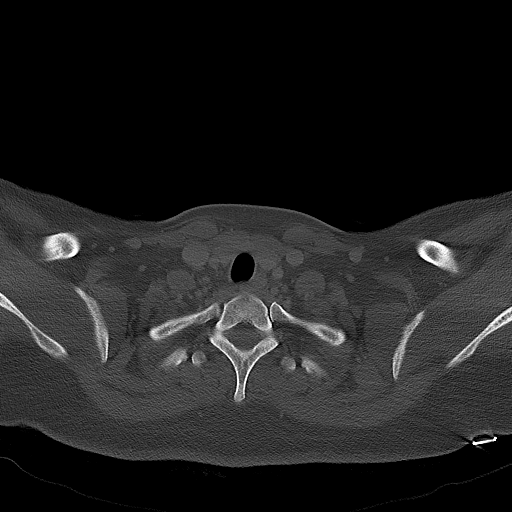
[im 67/101  bone]
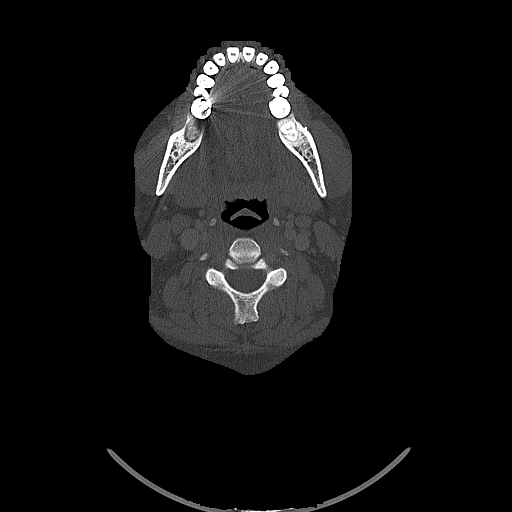

[Series 5: sag neck · sagittal · 0.45mm/px · 5 of 90 slices shown, 6 images]
[im 30/90  bone]
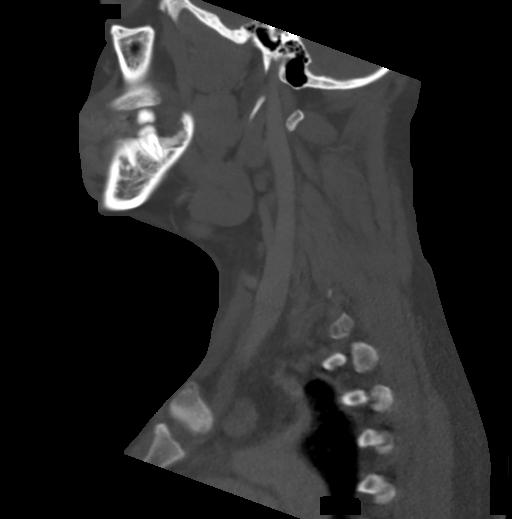
[im 38/90  bone]
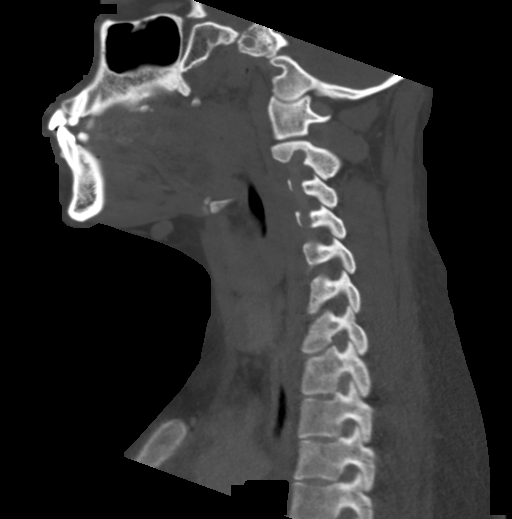
[im 45/90  soft-tissue]
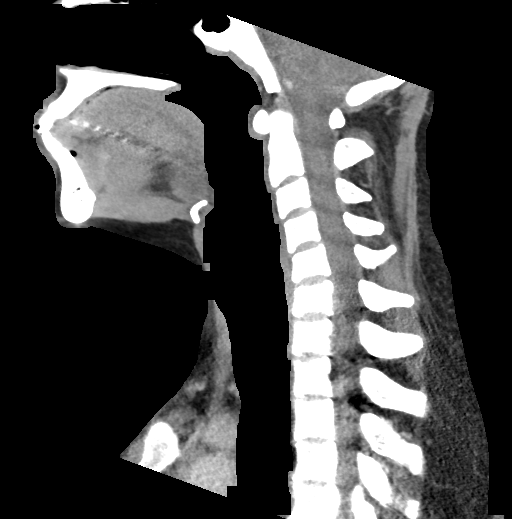
[im 45/90  bone]
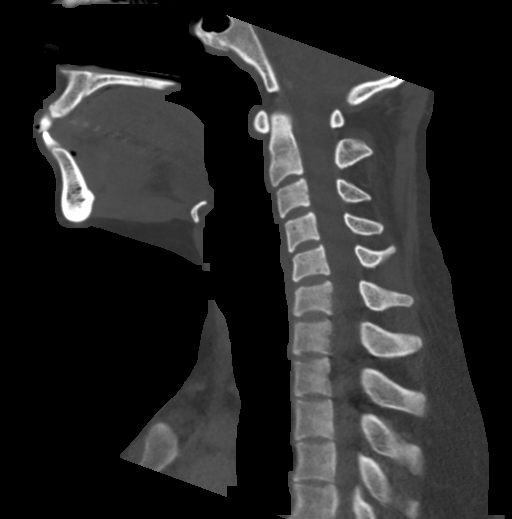
[im 52/90  bone]
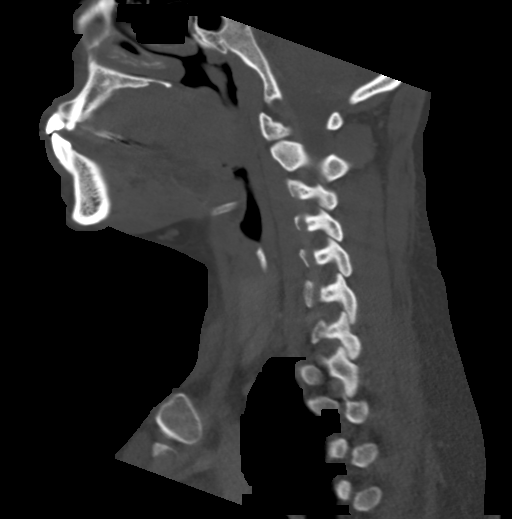
[im 60/90  bone]
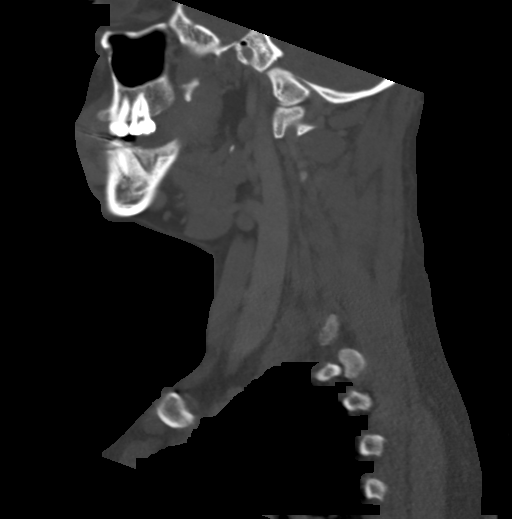

[Series 6: cor neck · coronal · 0.43mm/px · 3 of 100 slices shown]
[im 31/100  bone]
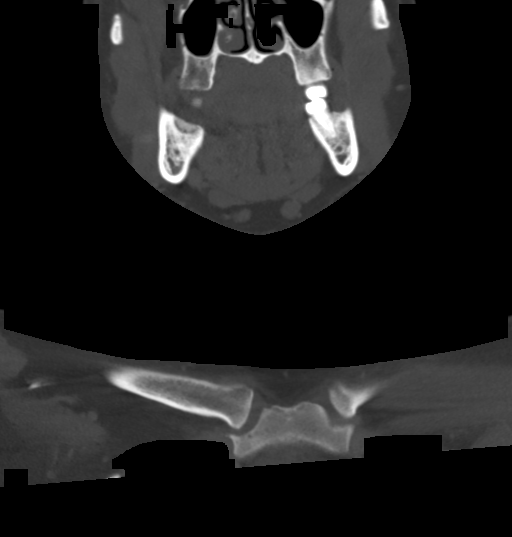
[im 44/100  bone]
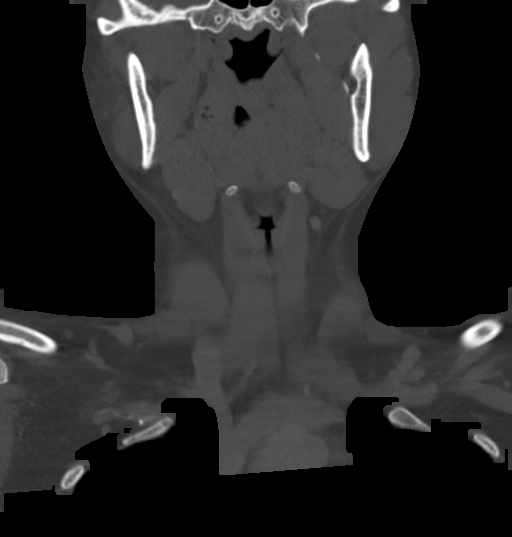
[im 57/100  bone]
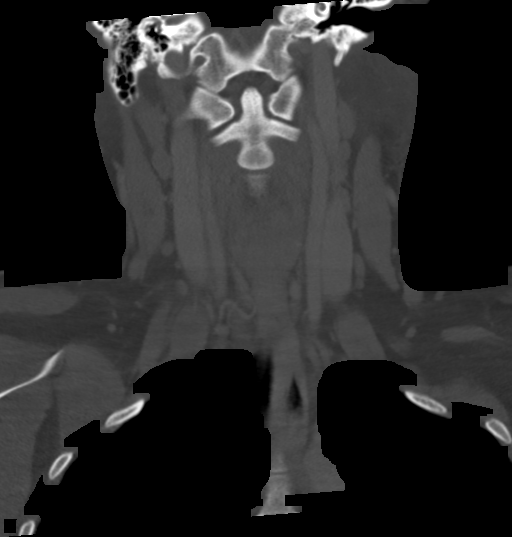

[Series 7: orthogonal (person_name) · axial · 0.40mm/px · z∈[-49,+54]mm · 3 of 114 slices shown]
[im 29/114  bone]
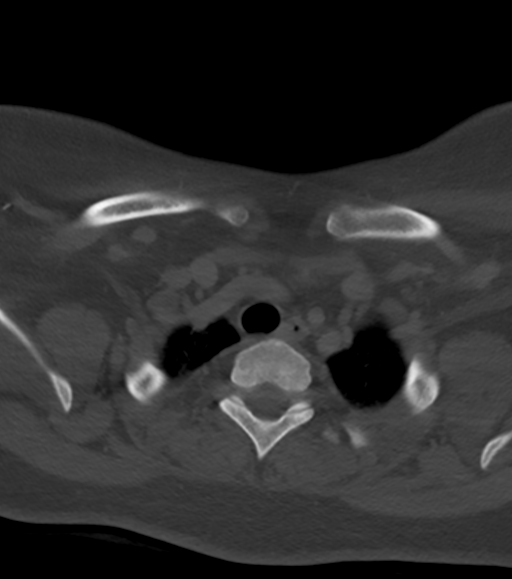
[im 57/114  bone]
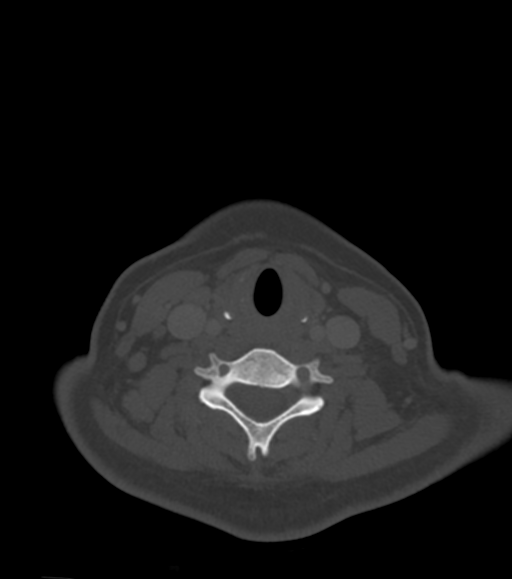
[im 85/114  bone]
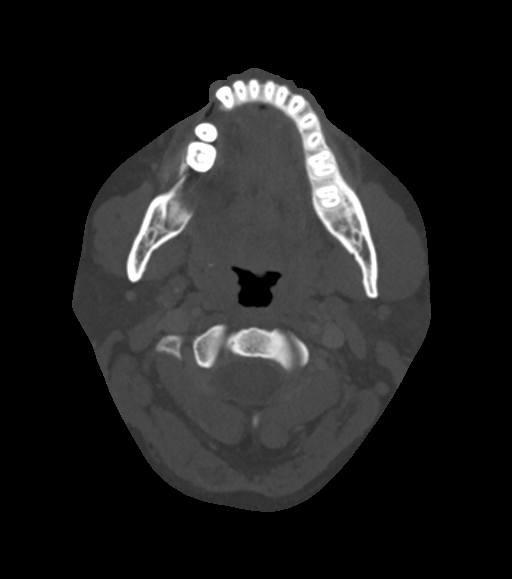

[16 of 35 positions shown; findings below may reference images not displayed]

RADIATION DOSE REDUCTION: This exam was performed according to the
departmental dose-optimization program which includes automated
exposure control, adjustment of the mA and/or kV according to
patient size and/or use of iterative reconstruction technique.

CONTRAST:  75mL OMNIPAQUE IOHEXOL 300 MG/ML  SOLN
FINDINGS: PHARYNX AND LARYNX: The nasopharynx, oropharynx and larynx are
normal. Visible portions of the oral cavity, tongue base and floor
of mouth are normal. Normal epiglottis, vallecula and pyriform
sinuses. The larynx is normal. No retropharyngeal abscess, effusion
or lymphadenopathy.

SALIVARY GLANDS: Normal parotid, submandibular and sublingual
glands.

THYROID: Normal.

LYMPH NODES: No enlarged or abnormal density lymph nodes.

VASCULAR: Major cervical vessels are patent.

LIMITED INTRACRANIAL: Normal.

VISUALIZED ORBITS: Normal.

MASTOIDS AND VISUALIZED PARANASAL SINUSES: No fluid levels or
advanced mucosal thickening. No mastoid effusion.

SKELETON: No bony spinal canal stenosis. No lytic or blastic
lesions.

UPPER CHEST: Clear.

OTHER: None.
IMPRESSION: Normal CT of the neck.
